# Patient Record
Sex: Male | Born: 1950 | Race: White | Hispanic: No | Marital: Married | Smoking: Former smoker
Health system: Southern US, Community
[De-identification: ages and names within clinical notes are randomized; demographics above are authoritative.]

## PROBLEM LIST (undated history)

## (undated) DIAGNOSIS — I110 Hypertensive heart disease with heart failure: Secondary | ICD-10-CM

## (undated) DIAGNOSIS — G4730 Sleep apnea, unspecified: Secondary | ICD-10-CM

## (undated) DIAGNOSIS — R7303 Prediabetes: Secondary | ICD-10-CM

## (undated) DIAGNOSIS — M1990 Unspecified osteoarthritis, unspecified site: Secondary | ICD-10-CM

## (undated) DIAGNOSIS — I700 Atherosclerosis of aorta: Secondary | ICD-10-CM

## (undated) DIAGNOSIS — E785 Hyperlipidemia, unspecified: Secondary | ICD-10-CM

## (undated) DIAGNOSIS — C61 Malignant neoplasm of prostate: Secondary | ICD-10-CM

## (undated) DIAGNOSIS — E538 Deficiency of other specified B group vitamins: Secondary | ICD-10-CM

## (undated) DIAGNOSIS — I10 Essential (primary) hypertension: Secondary | ICD-10-CM

## (undated) DIAGNOSIS — I739 Peripheral vascular disease, unspecified: Secondary | ICD-10-CM

## (undated) DIAGNOSIS — C801 Malignant (primary) neoplasm, unspecified: Secondary | ICD-10-CM

## (undated) DIAGNOSIS — N281 Cyst of kidney, acquired: Secondary | ICD-10-CM

## (undated) DIAGNOSIS — M8580 Other specified disorders of bone density and structure, unspecified site: Secondary | ICD-10-CM

## (undated) DIAGNOSIS — G4733 Obstructive sleep apnea (adult) (pediatric): Secondary | ICD-10-CM

## (undated) HISTORY — PX: OTHER SURGICAL HISTORY: SHX169

## (undated) HISTORY — PX: PROSTATE SURGERY: SHX751

---

## 1997-11-21 HISTORY — PX: KNEE ARTHROSCOPY: SUR90

## 2016-11-21 HISTORY — PX: PROSTATE SURGERY: SHX751

## 2018-11-21 HISTORY — PX: HERNIA REPAIR: SHX51

## 2019-04-22 HISTORY — PX: UMBILICAL HERNIA REPAIR: SHX196

## 2019-04-22 HISTORY — PX: HERNIA REPAIR: SHX51

## 2019-11-22 DIAGNOSIS — K52832 Lymphocytic colitis: Secondary | ICD-10-CM

## 2019-11-22 HISTORY — DX: Lymphocytic colitis: K52.832

## 2020-06-04 DIAGNOSIS — E782 Mixed hyperlipidemia: Secondary | ICD-10-CM | POA: Insufficient documentation

## 2020-06-17 DIAGNOSIS — Z8546 Personal history of malignant neoplasm of prostate: Secondary | ICD-10-CM | POA: Insufficient documentation

## 2020-06-18 DIAGNOSIS — K760 Fatty (change of) liver, not elsewhere classified: Secondary | ICD-10-CM | POA: Insufficient documentation

## 2020-06-18 DIAGNOSIS — M8589 Other specified disorders of bone density and structure, multiple sites: Secondary | ICD-10-CM | POA: Insufficient documentation

## 2020-06-18 DIAGNOSIS — I739 Peripheral vascular disease, unspecified: Secondary | ICD-10-CM | POA: Insufficient documentation

## 2020-06-18 DIAGNOSIS — I110 Hypertensive heart disease with heart failure: Secondary | ICD-10-CM | POA: Insufficient documentation

## 2020-06-18 DIAGNOSIS — G4733 Obstructive sleep apnea (adult) (pediatric): Secondary | ICD-10-CM | POA: Insufficient documentation

## 2020-06-18 DIAGNOSIS — N281 Cyst of kidney, acquired: Secondary | ICD-10-CM | POA: Insufficient documentation

## 2020-06-18 DIAGNOSIS — I25119 Atherosclerotic heart disease of native coronary artery with unspecified angina pectoris: Secondary | ICD-10-CM | POA: Insufficient documentation

## 2020-06-19 DIAGNOSIS — E538 Deficiency of other specified B group vitamins: Secondary | ICD-10-CM | POA: Insufficient documentation

## 2020-06-19 DIAGNOSIS — R7303 Prediabetes: Secondary | ICD-10-CM | POA: Insufficient documentation

## 2020-09-03 ENCOUNTER — Other Ambulatory Visit
Admission: RE | Admit: 2020-09-03 | Discharge: 2020-09-03 | Disposition: A | Discharge status: Home / Self Care | Payer: Medicare PPO | Source: Ambulatory Visit | Attending: Gastroenterology | Admitting: Gastroenterology

## 2020-09-03 ENCOUNTER — Other Ambulatory Visit: Payer: Self-pay

## 2020-09-03 DIAGNOSIS — Z01812 Encounter for preprocedural laboratory examination: Secondary | ICD-10-CM | POA: Diagnosis present

## 2020-09-03 DIAGNOSIS — Z20822 Contact with and (suspected) exposure to covid-19: Secondary | ICD-10-CM | POA: Diagnosis not present

## 2020-09-03 LAB — SARS CORONAVIRUS 2 (TAT 6-24 HRS): SARS Coronavirus 2: NEGATIVE

## 2020-09-07 ENCOUNTER — Encounter
Admission: RE | Disposition: A | Discharge status: Home / Self Care | Payer: Self-pay | Source: Home / Self Care | Attending: Gastroenterology

## 2020-09-07 ENCOUNTER — Encounter: Payer: Self-pay | Admitting: *Deleted

## 2020-09-07 ENCOUNTER — Ambulatory Visit: Payer: Medicare PPO | Admitting: Anesthesiology

## 2020-09-07 ENCOUNTER — Ambulatory Visit
Admission: RE | Admit: 2020-09-07 | Discharge: 2020-09-07 | Disposition: A | Discharge status: Home / Self Care | Payer: Medicare PPO | Attending: Gastroenterology | Admitting: Gastroenterology

## 2020-09-07 DIAGNOSIS — K621 Rectal polyp: Secondary | ICD-10-CM | POA: Diagnosis not present

## 2020-09-07 DIAGNOSIS — R194 Change in bowel habit: Secondary | ICD-10-CM | POA: Insufficient documentation

## 2020-09-07 DIAGNOSIS — Z880 Allergy status to penicillin: Secondary | ICD-10-CM | POA: Diagnosis not present

## 2020-09-07 DIAGNOSIS — K640 First degree hemorrhoids: Secondary | ICD-10-CM | POA: Insufficient documentation

## 2020-09-07 DIAGNOSIS — Z7982 Long term (current) use of aspirin: Secondary | ICD-10-CM | POA: Insufficient documentation

## 2020-09-07 DIAGNOSIS — I10 Essential (primary) hypertension: Secondary | ICD-10-CM | POA: Diagnosis not present

## 2020-09-07 DIAGNOSIS — E669 Obesity, unspecified: Secondary | ICD-10-CM | POA: Diagnosis not present

## 2020-09-07 DIAGNOSIS — Z79899 Other long term (current) drug therapy: Secondary | ICD-10-CM | POA: Insufficient documentation

## 2020-09-07 DIAGNOSIS — Z8546 Personal history of malignant neoplasm of prostate: Secondary | ICD-10-CM | POA: Diagnosis not present

## 2020-09-07 DIAGNOSIS — G4730 Sleep apnea, unspecified: Secondary | ICD-10-CM | POA: Insufficient documentation

## 2020-09-07 DIAGNOSIS — D1779 Benign lipomatous neoplasm of other sites: Secondary | ICD-10-CM | POA: Diagnosis not present

## 2020-09-07 DIAGNOSIS — K52832 Lymphocytic colitis: Secondary | ICD-10-CM | POA: Insufficient documentation

## 2020-09-07 DIAGNOSIS — Z6832 Body mass index (BMI) 32.0-32.9, adult: Secondary | ICD-10-CM | POA: Insufficient documentation

## 2020-09-07 HISTORY — PX: COLONOSCOPY: SHX5424

## 2020-09-07 HISTORY — DX: Malignant (primary) neoplasm, unspecified: C80.1

## 2020-09-07 HISTORY — DX: Essential (primary) hypertension: I10

## 2020-09-07 HISTORY — DX: Sleep apnea, unspecified: G47.30

## 2020-09-07 SURGERY — COLONOSCOPY
Anesthesia: General

## 2020-09-07 MED ORDER — EPHEDRINE SULFATE 50 MG/ML IJ SOLN
INTRAMUSCULAR | Status: DC | PRN
  Administered 2020-09-07: 5 mg via INTRAVENOUS
  Administered 2020-09-07: 10 mg via INTRAVENOUS

## 2020-09-07 MED ORDER — SODIUM CHLORIDE 0.9 % IV SOLN: INTRAVENOUS | Status: DC

## 2020-09-07 MED ORDER — PROPOFOL 500 MG/50ML IV EMUL
INTRAVENOUS | Status: DC | PRN
  Administered 2020-09-07: 140 ug/kg/min via INTRAVENOUS

## 2020-09-07 MED ORDER — PROPOFOL 10 MG/ML IV BOLUS
INTRAVENOUS | Status: DC | PRN
  Administered 2020-09-07: 20 mg via INTRAVENOUS
  Administered 2020-09-07: 80 mg via INTRAVENOUS
  Administered 2020-09-07: 10 mg via INTRAVENOUS
  Administered 2020-09-07: 30 mg via INTRAVENOUS

## 2020-09-07 NOTE — Anesthesia Preprocedure Evaluation (Signed)
Anesthesia Evaluation  Patient identified by MRN, date of birth, ID band Patient awake    Reviewed: Allergy & Precautions, NPO status , Patient's Chart, lab work & pertinent test results  History of Anesthesia Complications Negative for: history of anesthetic complications  Airway Mallampati: II  TM Distance: >3 FB Neck ROM: Full    Dental  (+) Poor Dentition   Pulmonary sleep apnea and Continuous Positive Airway Pressure Ventilation , neg COPD,    breath sounds clear to auscultation- rhonchi (-) wheezing      Cardiovascular hypertension, Pt. on medications (-) CAD, (-) Past MI, (-) Cardiac Stents and (-) CABG  Rhythm:Regular Rate:Normal - Systolic murmurs and - Diastolic murmurs    Neuro/Psych neg Seizures negative neurological ROS  negative psych ROS   GI/Hepatic negative GI ROS, Neg liver ROS,   Endo/Other  negative endocrine ROSneg diabetes  Renal/GU negative Renal ROS     Musculoskeletal negative musculoskeletal ROS (+)   Abdominal (+) + obese,   Peds  Hematology negative hematology ROS (+)   Anesthesia Other Findings Past Medical History: No date: Cancer ([place])     Comment:  prostate No date: Hypertension No date: Sleep apnea   Reproductive/Obstetrics                             Anesthesia Physical Anesthesia Plan  ASA: II  Anesthesia Plan: General   Post-op Pain Management:    Induction: Intravenous  PONV Risk Score and Plan: 1 and Propofol infusion  Airway Management Planned: Natural Airway  Additional Equipment:   Intra-op Plan:   Post-operative Plan:   Informed Consent: I have reviewed the patients History and Physical, chart, labs and discussed the procedure including the risks, benefits and alternatives for the proposed anesthesia with the patient or authorized representative who has indicated his/her understanding and acceptance.     Dental advisory  given  Plan Discussed with: CRNA and Anesthesiologist  Anesthesia Plan Comments:         Anesthesia Quick Evaluation

## 2020-09-07 NOTE — Op Note (Signed)
[facility] Patient Name: Chad Norman Procedure Date: 09/07/2020 12:30 PM MRN: [id number] Account #: [id number] Date of Birth: [birth year] Admit Type: Outpatient Age: 69 Room: [facility] ENDO ROOM 4 Gender: Male Note Status: Finalized Procedure:             Colonoscopy Indications:           Change in bowel habits Providers:             Andrey Farmer MD, MD Referring MD:          Toni Arthurs (Referring MD) Medicines:             Monitored Anesthesia Care Complications:         No immediate complications. Estimated blood loss:                         Minimal. Procedure:             Pre-Anesthesia Assessment:                        - Prior to the procedure, a History and Physical was                         performed, and patient medications and allergies were                         reviewed. The patient is competent. The risks and                         benefits of the procedure and the sedation options and                         risks were discussed with the patient. All questions                         were answered and informed consent was obtained.                         Patient identification and proposed procedure were                         verified by the physician, the nurse, the anesthetist                         and the technician in the endoscopy suite. Mental                         Status Examination: alert and oriented. Airway                         Examination: normal oropharyngeal airway and neck                         mobility. Respiratory Examination: clear to                         auscultation. CV Examination: normal. Prophylactic                         Antibiotics: The  patient does not require prophylactic                         antibiotics. Prior Anticoagulants: The patient has                         taken no previous anticoagulant or antiplatelet                         agents. ASA Grade Assessment: II - A  patient with mild                         systemic disease. After reviewing the risks and                         benefits, the patient was deemed in satisfactory                         condition to undergo the procedure. The anesthesia                         plan was to use monitored anesthesia care (MAC).                         Immediately prior to administration of medications,                         the patient was re-assessed for adequacy to receive                         sedatives. The heart rate, respiratory rate, oxygen                         saturations, blood pressure, adequacy of pulmonary                         ventilation, and response to care were monitored                         throughout the procedure. The physical status of the                         patient was re-assessed after the procedure.                        After obtaining informed consent, the colonoscope was                         passed under direct vision. Throughout the procedure,                         the patient's blood pressure, pulse, and oxygen                         saturations were monitored continuously. The                         Colonoscope was introduced through the anus and  advanced to the the terminal ileum. The colonoscopy                         was performed without difficulty. The patient                         tolerated the procedure well. The quality of the bowel                         preparation was good. Findings:      The perianal and digital rectal examinations were normal.      The terminal ileum appeared normal.      There was a small lipoma, in the ascending colon.      A 2 mm polyp was found in the descending colon. The polyp was sessile.       The polyp was removed with a cold biopsy forceps. Resection and       retrieval were complete. Estimated blood loss was minimal.      A 2 mm polyp was found in the rectum. The polyp was sessile. The  polyp       was removed with a cold snare. Resection and retrieval were complete.       Estimated blood loss was minimal.      A diffuse area of granular mucosa was found in the rectum. Biopsies were       taken with a cold forceps for histology. Estimated blood loss was       minimal.      Biopsies for histology were taken with a cold forceps from the entire       colon for evaluation of microscopic colitis. Estimated blood loss was       minimal.      Non-bleeding internal hemorrhoids were found during retroflexion. The       hemorrhoids were Grade I (internal hemorrhoids that do not prolapse).      The exam was otherwise without abnormality on direct and retroflexion       views. Impression:            - The examined portion of the ileum was normal.                        - Small lipoma in the ascending colon.                        - One 2 mm polyp in the descending colon, removed with                         a cold biopsy forceps. Resected and retrieved.                        - One 2 mm polyp in the rectum, removed with a cold                         snare. Resected and retrieved.                        - Granularity in the rectum. Biopsied.                        -  Non-bleeding internal hemorrhoids.                        - The examination was otherwise normal on direct and                         retroflexion views.                        - Biopsies were taken with a cold forceps from the                         entire colon for evaluation of microscopic colitis. Recommendation:        - Discharge patient to home.                        - Resume previous diet.                        - Continue present medications.                        - Await pathology results.                        - Repeat colonoscopy date to be determined after                         pending pathology results are reviewed for                         surveillance.                        - Return to  referring physician as previously                         scheduled. Procedure Code(s):     --- Professional ---                        [phone], Colonoscopy, flexible; with removal of                         tumor(s), polyp(s), or other lesion(s) by snare                         technique                        [id number], 54, Colonoscopy, flexible; with biopsy, single                         or multiple Diagnosis Code(s):     --- Professional ---                        K64.0, First degree hemorrhoids                        D17.5, Benign lipomatous neoplasm of intra-abdominal  organs                        K63.5, Polyp of colon                        K62.1, Rectal polyp                        K62.89, Other specified diseases of anus and rectum                        R19.4, Change in bowel habit CPT copyright 2019 [employer]. All rights reserved. The codes documented in this report are preliminary and upon coder review may  be revised to meet current compliance requirements. Andrey Farmer, MD Andrey Farmer MD, MD 09/07/2020 1:13:25 PM Number of Addenda: 0 Note Initiated On: 09/07/2020 12:30 PM Scope Withdrawal Time: 0 hours 17 minutes 28 seconds  Total Procedure Duration: 0 hours 26 minutes 59 seconds  Estimated Blood Loss:  Estimated blood loss was minimal.      [facility]

## 2020-09-07 NOTE — Transfer of Care (Signed)
Immediate Anesthesia Transfer of Care Note  Patient: Chad Norman  Procedure(s) Performed: COLONOSCOPY (N/A )  Patient Location: PACU  Anesthesia Type:General  Level of Consciousness: sedated  Airway & Oxygen Therapy: Patient Spontanous Breathing  Post-op Assessment: Report given to RN and Post -op Vital signs reviewed and stable  Post vital signs: Reviewed and stable  Last Vitals:  Vitals Value Taken Time  BP 113/62 09/07/20 1312  Temp    Pulse 69 09/07/20 1312  Resp 11 09/07/20 1312  SpO2 98 % 09/07/20 1312  Vitals shown include unvalidated device data.  Last Pain:  Vitals:   09/07/20 1158  TempSrc: Tympanic         Complications: No complications documented.

## 2020-09-07 NOTE — H&P (Signed)
Outpatient short stay form Pre-procedure 09/07/2020 12:08 PM Raylene Miyamoto MD, MPH  Primary Physician: NP Payton Mccallum  Reason for visit:  Change in bowel habits  History of present illness:   69 y/o gentleman with history of change in bowel movements with history of colonoscopy in 2018 but do not have full report. Looks like they recommended barium enema afterwards. No blood thinners. No family history of GI malignancies.    Current Facility-Administered Medications:  .  0.9 %  sodium chloride infusion, , Intravenous, Continuous, Johnpaul Gillentine, Hilton Cork, MD  Medications Prior to Admission  Medication Sig Dispense Refill Last Dose  . aspirin EC 81 MG tablet Take 81 mg by mouth daily. Swallow whole.     . Biotin 10000 MCG TABS Take by mouth.     . carvedilol (COREG) 12.5 MG tablet Take 12.5 mg by mouth 2 (two) times daily with a meal.   09/07/2020 at Unknown time  . chlorthalidone (HYGROTON) 25 MG tablet Take 25 mg by mouth daily as needed.   09/07/2020 at Unknown time  . Cholecalciferol (VITAMIN D) 50 MCG (2000 UT) tablet Take 2,000 Units by mouth daily.   09/07/2020 at Unknown time  . irbesartan (AVAPRO) 300 MG tablet Take 300 mg by mouth daily.     Marland Kitchen loratadine (CLARITIN) 10 MG tablet Take 10 mg by mouth daily.     . Misc Natural Products (AIRBORNE ELDERBERRY PO) Take by mouth.     . sildenafil (VIAGRA) 100 MG tablet Take 100 mg by mouth daily as needed for erectile dysfunction.     . vitamin B-12 (CYANOCOBALAMIN) 1000 MCG tablet Take 1,000 mcg by mouth daily.   09/07/2020 at Unknown time     Allergies  Allergen Reactions  . Amoxil [Amoxicillin]     diarrhea     Past Medical History:  Diagnosis Date  . [facility]    prostate  . Hypertension   . Sleep apnea     Review of systems:  Otherwise negative.    Physical Exam  Gen: Alert, oriented. Appears stated age.  HEENT: [place]/AT. PERRLA. Lungs: No respiratory distress CV: RRR Abd: soft, benign, no masses.  Ext: No edema.  Pulses 2+    Planned procedures: Proceed with colonoscopy. The patient understands the nature of the planned procedure, indications, risks, alternatives and potential complications including but not limited to bleeding, infection, perforation, damage to internal organs and possible oversedation/side effects from anesthesia. The patient agrees and gives consent to proceed.  Please refer to procedure notes for findings, recommendations and patient disposition/instructions.     Raylene Miyamoto MD, MPH Gastroenterology 09/07/2020  12:08 PM

## 2020-09-07 NOTE — Interval H&P Note (Signed)
History and Physical Interval Note:  09/07/2020 12:12 PM  Chad Norman  has presented today for surgery, with the diagnosis of bowel habit changes.  The various methods of treatment have been discussed with the patient and family. After consideration of risks, benefits and other options for treatment, the patient has consented to  Procedure(s): COLONOSCOPY (N/A) as a surgical intervention.  The patient's history has been reviewed, patient examined, no change in status, stable for surgery.  I have reviewed the patient's chart and labs.  Questions were answered to the patient's satisfaction.     [name]  Ok to proceed with colonoscopy

## 2020-09-07 NOTE — Anesthesia Postprocedure Evaluation (Signed)
Anesthesia Post Note  Patient: Chad Norman  Procedure(s) Performed: COLONOSCOPY (N/A )  Patient location during evaluation: Endoscopy Anesthesia Type: General Level of consciousness: awake and alert and oriented Pain management: pain level controlled Vital Signs Assessment: post-procedure vital signs reviewed and stable Respiratory status: spontaneous breathing, nonlabored ventilation and respiratory function stable Cardiovascular status: blood pressure returned to baseline and stable Postop Assessment: no signs of nausea or vomiting Anesthetic complications: no   No complications documented.   Last Vitals:  Vitals:   09/07/20 1312 09/07/20 1330  BP: 113/62   Pulse: 66   Resp: 10 16  Temp:    SpO2: 98%     Last Pain:  Vitals:   09/07/20 1330  TempSrc:   PainSc: 0-No pain                 Ahamed Hofland

## 2020-09-08 ENCOUNTER — Encounter: Payer: Self-pay | Admitting: Gastroenterology

## 2020-09-09 LAB — SURGICAL PATHOLOGY

## 2020-11-25 ENCOUNTER — Ambulatory Visit: Payer: Medicare Other | Attending: Urology | Admitting: Physical Therapy

## 2020-11-25 ENCOUNTER — Encounter: Payer: Self-pay | Admitting: Physical Therapy

## 2020-11-25 ENCOUNTER — Other Ambulatory Visit: Payer: Self-pay

## 2020-11-25 DIAGNOSIS — M6208 Separation of muscle (nontraumatic), other site: Secondary | ICD-10-CM | POA: Diagnosis present

## 2020-11-25 DIAGNOSIS — M533 Sacrococcygeal disorders, not elsewhere classified: Secondary | ICD-10-CM | POA: Diagnosis present

## 2020-11-25 DIAGNOSIS — R2689 Other abnormalities of gait and mobility: Secondary | ICD-10-CM | POA: Insufficient documentation

## 2020-11-25 DIAGNOSIS — M25561 Pain in right knee: Secondary | ICD-10-CM | POA: Diagnosis present

## 2020-11-25 DIAGNOSIS — M2170 Unequal limb length (acquired), unspecified site: Secondary | ICD-10-CM | POA: Insufficient documentation

## 2020-11-25 NOTE — Therapy (Signed)
[facility] MAIN [facility] SERVICES [address] [place], [place], [address] Phone: [phone]   Fax:  [phone]  Physical Therapy Evaluation  Patient Details  Name: Chad Norman MRN: [id number] Date of Birth: March 03, 1951 Referring Provider (PT): Evelene Croon   Encounter Date: 11/25/2020   PT End of Session - 11/25/20 1327    Visit Number 1    Number of Visits 10    Date for PT Re-Evaluation 02/03/21    PT Start Time 1300    PT Stop Time 1424    PT Time Calculation (min) 84 min           Past Medical History:  Diagnosis Date  . Cancer [facility])    prostate  . Hypertension   . Sleep apnea     Past Surgical History:  Procedure Laterality Date  . arthoscopy knee Right   . COLONOSCOPY N/A 09/07/2020   Procedure: COLONOSCOPY;  Surgeon: Regis Bill, MD;  Location: [facility] ENDOSCOPY;  Service: Endoscopy;  Laterality: N/A;  . HERNIA REPAIR  04/2019  . HERNIA REPAIR  2020   umbilical   . PROSTATE SURGERY  2018   RALP    There were no vitals filed for this visit.    Subjective Assessment - 11/25/20 1307    Subjective 1) SUI:  Pt had RALP for Prostate cancer in 2018 and learned to do kegel exercises but only kept up with them off and on for several years.  Pt reports 75% improvement with urinary leakage since the surgery.  Currently, pt is wearing 4-5 belted pads per day and had not had to use one at night. Very little leakage occurs at night. Nocturia 1-2 x night , usually 1x and pt uses his CPAP since 2003. Standing up is worst with leakage compared to lying down. Getting into the car also causes leakage.    2)  urinary frequency occurs more often after dinner.  Daily fluid intake: "not enough" water (16 fl oz), no sodas, teas, juice, 16 fl oz coffee.  3) painful tailbone in the last ten years. Pt used to have to sit on a donut. Hx of a fall onto tailbone in 1999 . Currently, pain is 3-4/10.     4)  loose bowels started 2021 but  previously pt had constipation.  Pt used to have Type 4 consistency 100% of the time [facility] Stool type) but lately it has been Type 6 100% of the time.   Hx of umbilical hernai repair 2020.     5) R knee pain with walking for  >30 min, standing for > 20 min, pain level  7/10.     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 Pt sought Tx from chiropractor and spine doctor and PT. No longer  has radiating pain.    Hobbies: fix things, plays keyboard, guitar, fishing      Patient Stated Goals decrease diapers, ride a bicycle              [facility] PT Assessment - 11/25/20 1312      Assessment   Medical Diagnosis SUI    Referring Provider (PT) Evelene Croon      Precautions   Precautions None      Restrictions   Weight Bearing Restrictions No      Balance Screen  Has the patient fallen in the past 6 months No      Observation/Other Assessments   Observations ankles crossed.    Scoliosis L iliac crest, R shoulder higher, T/L junction and L lumbar convex curve  ( Post Tx: with shoe lift in R shoe, shoulder and iliac crest levelled, no spinal deviations)      AROM   Overall AROM Comments all 6 directions of thoracic spine , very limited, R knee in supine -40 deg ext R, L -10 deg ext, seated: DF OKC unable to DF , L 30 deg      Strength   Overall Strength Comments BLE 4/5 , R hip flex 4-/5, L 5/5      Palpation   Spinal mobility limited lateral excursion  B    SI assessment  supine ASIS levelled, R knee in flexion/ hip abd/ER      Bed Mobility   Bed Mobility --   crunch method                     Objective measurements completed on examination: See above findings.     Pelvic Floor Special Questions - 11/25/20 1341    Diastasis Recti 3 fingers width, abdominal bulging,  scar restrictions at umbilicus    External Perineal Exam deferred for next  session. noted wet spot on sheet indication, incontinence through pants/ belted pad            [facility] Adult PT Treatment/Exercise - 11/25/20 1312      Ambulation/Gait   Gait Comments 0.79 m/s with gait deviations ( less R trunk lean, decreased R stance). ( Post Tx: 0.9 m/s more equal WBing on BLE, less lean, no decreased R stance phase)   no AD                      PT Long Term Goals - 11/25/20 1311      PT LONG TERM GOAL #1   Title Pt will increase gait speed from 0.79 m/s with less gait deviations ( less R trunk lean, decreased R stance) to increased speed > 1.0 m/s  without gait deviations ( reciporcal gait, equal WBing, less trunk lean) to minimize risks for falls and ambulate safely in community    Time 10    Period Weeks    Status New    Target Date 02/03/21      PT LONG TERM GOAL #2   Title Pt will report being able to to walking for  >30 min, standing > 20 min and report a decreased in R knee pain level  7/10 to > 3/10 in order to  participate in community activities    Time 8    Period Weeks    Status New    Target Date 01/20/21      PT LONG TERM GOAL #3   Title Pt will demo increased lateral diaphragmatic excursion,  less abdominal separation from 3 fingers width to < 2 fingers width, I with deep core coordination and strength HEP to improve IAP for better urinary continence and less risk for worsening of umbilical hernia    Time 5    Period Weeks    Status New    Target Date 12/30/20      PT LONG TERM GOAL #4   Title Pt will report decreasing his belted pad wear from 4-5 per day to < 2 per day in order to improve hygiene and demo improved  continence    Time 10    Period Weeks    Status New    Target Date 02/03/21      PT LONG TERM GOAL #5   Title Pt will demo equal pelvic girdle and less spinal deviations with compliance to R shoe lift in order to progress to deep core. / pelvic floor exercises with greater outcomes and to improve gait speed and to  decrease R knee pain/ tailbone pain    Time 2    Period Weeks    Status New    Target Date 12/09/20      Additional Long Term Goals   Additional Long Term Goals Yes      PT LONG TERM GOAL #6   Title Pt will demo improved FOTO scores for urinary problem from 51 pts to > 56 pts    and for R knee pain 47 pts to > 62 pts  in order to improve QOL and function    Time 10    Period Weeks    Status New    Target Date 02/03/21      PT LONG TERM GOAL #7   Title Pt will report improved stool consistency from Type 6 100% of the time to Type 4  50% of the time in order to restore GI health and GI motility with improved IAP system and motility    Time 10    Period Weeks    Status New    Target Date 02/03/21      PT LONG TERM GOAL #8   Title Pt will increase water intake from 16 fl oz to 32 floz while drinking 16 floz of coffe per day in order to improve water to bladder irritant ratio for bladder and bowel health    Time 4    Period Weeks    Status New    Target Date 12/23/20      PT LONG TERM GOAL  #9   TITLE Pt will demo proper body mechanics to minimize straining pelvic floor and minimize worsening of umbilicalhernia    Time 4    Period Weeks    Status New    Target Date 12/23/20      PT LONG TERM GOAL  #10   TITLE Pt will demo increased knee extension on R from -40 deg to < -20 deg  ( L knee ext -10 deg) in order to return riding a bicycle.    Time 8    Period Weeks    Status New    Target Date 01/20/21                  Plan - 11/25/20 1413    Clinical Impression Statement  Pt is a  71  yo who presents with SUI and urinary frequency, loose bowel movements, tailbone pain, and R knee pain. These deficits impact his ADLs and QOL.   Pt's musculoskeletal assessment revealed unequal leg length, scoliosis, unequal shoulder height, gait deviations , slowed gait speed, limited knee extension and DF AROM on R> L, spinal mobility severely limited in rotation/ sideflexion,   Diastasis recti with abdominal bulging, dyscoordination and strength of pelvic floor mm, poor body mechanics which places strain on the abdominal/pelvic floor mm.   These are deficits that indicate an ineffective intraabdominal pressure system associated with increased risk for pt's Sx. Pertinent Hx include RALP in [id number], umbilical hernia repair, Hx of a fall onto tailbone in 1999, R sciatica in 2015.  Pt was provided education on etiology of Sx with anatomy, physiology explanation with images along with the benefits of customized pelvic PT Tx based on pt's medical conditions and musculoskeletal deficits.  Explained the physiology of deep core mm coordination and roles of pelvic floor function in urination, defecation, sexual function, and postural control with deep core mm system.   Regional interdependent approaches will yield greater benefits in pt's POC due to the complexity of pt's medical Hx and the significant impact their Sx have had on their QOL.  Following Tx today which pt tolerated without complaints, pt demo'd equal alignment of pelvic girdle and shoulder height with R shoe lift. Pt also demo'd improved body mechanics to minimize straining abdominopelvic areas and worsening of umbilical hernia and optimize IAP system for less leakage in sit to stand t/f. Today's improvements with more equal alignment of pelvic girdle and less spinal deviations will yield longer lasting outcomes for pelvic floor training.  Next session,  plan to initiate manual Tx to optimize lateral and anterior excursion of diaphragm for better IAP system and pelvic floor function. Pt benefit from skilled PT.      Examination-Activity Limitations Continence;Toileting;Stand;Locomotion Level    Stability/Clinical Decision Making Evolving/Moderate complexity    Clinical Decision Making High    Rehab Potential Good    PT Frequency 1x / week    PT Duration Other (comment)   10   PT Treatment/Interventions Neuromuscular  re-education;Balance training;Scar mobilization;Stair training;Moist Heat;Therapeutic activities;Therapeutic exercise;Patient/family education;Functional mobility training;Cryotherapy;Biofeedback;Dry needling;Energy conservation;Manual techniques;Taping;Splinting;Joint Manipulations;Passive range of motion    Consulted and Agree with Plan of Care Patient;Family member/caregiver           Patient will benefit from skilled therapeutic intervention in order to improve the following deficits and impairments:  Increased muscle spasms,Postural dysfunction,Improper body mechanics,Pain,Abnormal gait,Decreased activity tolerance,Decreased coordination,Decreased mobility,Decreased endurance,Decreased range of motion,Difficulty walking,Decreased strength,Hypomobility,Impaired flexibility  Visit Diagnosis: Leg length difference, acquired  Sacrococcygeal disorders, not elsewhere classified  Diastasis recti  Other abnormalities of gait and mobility  Right knee pain, unspecified chronicity     Problem List There are no problems to display for this patient.   Jerl Mina ,PT, DPT, E-RYT  11/25/2020, 2:28 PM  [place] MAIN [facility] SERVICES [address] [place], [place], [address] Phone: [phone]   Fax:  [phone]  Name: Chad Norman MRN: [id number] Date of Birth: 11/22/1950

## 2020-11-25 NOTE — Patient Instructions (Signed)
  Avoid straining pelvic floor, abdominal muscles , spine  Use log rolling technique instead of getting out of bed with your neck or the sit-up     Log rolling into and out of bed   Log rolling into and out of bed If getting out of bed on R side, Bent knees, scoot hips/ shoulder to L  Raise R arm completely overhead, rolling onto armpit  Then lower bent knees to bed to get into complete side lying position  Then drop legs off bed, and push up onto R elbow/forearm, and use L hand to push onto the bed   ___   Proper body mechanics with getting out of a chair to decrease strain  on back &pelvic floor   Avoid holding your breath when Getting out of the chair:  Scoot to front part of chair chair Heels behind feet, feet are hip width apart, nose over toes  Inhale like you are smelling roses Exhale to stand    __  Wear R shoe lift this week and discontinue if causes pain   ___  Increase water intake from 16 fl oz to 32 fl oz if you keep 16 fl oz coffee  This week

## 2020-11-30 ENCOUNTER — Ambulatory Visit: Payer: Medicare Other | Admitting: Physical Therapy

## 2020-11-30 ENCOUNTER — Other Ambulatory Visit: Payer: Self-pay

## 2020-11-30 DIAGNOSIS — R2689 Other abnormalities of gait and mobility: Secondary | ICD-10-CM

## 2020-11-30 DIAGNOSIS — M2170 Unequal limb length (acquired), unspecified site: Secondary | ICD-10-CM | POA: Diagnosis not present

## 2020-11-30 DIAGNOSIS — M25561 Pain in right knee: Secondary | ICD-10-CM

## 2020-11-30 DIAGNOSIS — M533 Sacrococcygeal disorders, not elsewhere classified: Secondary | ICD-10-CM

## 2020-11-30 DIAGNOSIS — M6208 Separation of muscle (nontraumatic), other site: Secondary | ICD-10-CM

## 2020-11-30 NOTE — Patient Instructions (Signed)
6 directions of neck with rolled towel and anotehr towel under head    chin up and down  side bend   Rotation   10 reps each side    __  angel wings   Elbows bent,   10 reps   ___   Lengthen Back rib by R  shoulder    Lie on L  side , pillow between knees and under head  Pull  arm overhead over mattress, grab the edge of mattress,pull it upward, drawing elbow away from ears  Breathing 10 reps  Open book  Lying on  _ side , rotating  __ only this week  Rotating onto pillow /yoga block  Pillow/ Block between knees  10 reps

## 2020-11-30 NOTE — Therapy (Signed)
[place] MAIN [facility] SERVICES [address] [place], [place], [address] Phone: [phone]   Fax:  [phone]  Physical Therapy Treatment  Patient Details  Name: Chad Ferrentino MRN: [id number] Date of Birth: [birth year] Referring Provider (PT): Yves Dill   Encounter Date: 11/30/2020   PT End of Session - 11/30/20 1756    Visit Number 2    Number of Visits 10    Date for PT Re-Evaluation 02/03/21    PT Start Time 1504    PT Stop Time 1600    PT Time Calculation (min) 56 min           Past Medical History:  Diagnosis Date  . Cancer [facility])    prostate  . Hypertension   . Sleep apnea     Past Surgical History:  Procedure Laterality Date  . arthoscopy knee Right   . COLONOSCOPY N/A 09/07/2020   Procedure: COLONOSCOPY;  Surgeon: [name], MD;  Location[facility] ENDOSCOPY;  Service: Endoscopy;  Laterality: N/A;  . HERNIA REPAIR  04/2019  . HERNIA REPAIR  4034   umbilical   . PROSTATE SURGERY  2018   RALP    There were no vitals filed for this visit.   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 Pt sought Tx from chiropractor and spine doctor and PT. No longer  has radiating pain.    Patient Stated Goals decrease diapers, ride a bicycle              [facility] PT Assessment - 11/30/20 1551      Palpation   Spinal mobility tightness at thoracic/ medial scap/ cervical B    SI assessment  levelled pelvic girdle with shoe lift                         [facility] Adult PT Treatment/Exercise - 11/30/20 1552      Neuro Re-ed    Neuro Re-ed Details  cued for spinal stretches      Modalities   Modalities Moist Heat      Moist Heat Therapy   Number Minutes Moist Heat 5 Minutes     Moist Heat Location --   cervical and thoracic     Manual Therapy   Manual therapy comments STM/MWM at problem areas to promote mobility of spine                       PT Long Term Goals - 11/25/20 1311      PT LONG TERM GOAL #1   Title Pt will increase gait speed from 0.79 m/s with less gait deviations ( less R trunk lean, decreased R stance) to increased speed > 1.0 m/s  without gait deviations ( reciporcal gait, equal WBing, less trunk lean) to minimize risks for falls and ambulate safely in community    Time 10    Period Weeks    Status New    Target Date 02/03/21      PT LONG TERM GOAL #2   Title Pt will report being able to to walking for  >30 min, standing > 20 min and report a decreased in  R knee pain level  7/10 to > 3/10 in order to  participate in community activities    Time 8    Period Weeks    Status New    Target Date 01/20/21      PT LONG TERM GOAL #3   Title Pt will demo increased lateral diaphragmatic excursion,  less abdominal separation from 3 fingers width to < 2 fingers width, I with deep core coordination and strength HEP to improve IAP for better urinary continence and less risk for worsening of umbilical hernia    Time 5    Period Weeks    Status New    Target Date 12/30/20      PT LONG TERM GOAL #4   Title Pt will report decreasing his belted pad wear from 4-5 per day to < 2 per day in order to improve hygiene and demo improved continence    Time 10    Period Weeks    Status New    Target Date 02/03/21      PT LONG TERM GOAL #5   Title Pt will demo equal pelvic girdle and less spinal deviations with compliance to R shoe lift in order to progress to deep core. / pelvic floor exercises with greater outcomes and to improve gait speed and to decrease R knee pain/ tailbone pain    Time 2    Period Weeks    Status New    Target Date 12/09/20      Additional Long Term Goals   Additional Long Term Goals Yes      PT LONG TERM GOAL #6    Title Pt will demo improved FOTO scores for urinary problem from 51 pts to > 56 pts    and for R knee pain 47 pts to > 62 pts  in order to improve QOL and function    Time 10    Period Weeks    Status New    Target Date 02/03/21      PT LONG TERM GOAL #7   Title Pt will report improved stool consistency from Type 6 100% of the time to Type 4  50% of the time in order to restore GI health and GI motility with improved IAP system and motility    Time 10    Period Weeks    Status New    Target Date 02/03/21      PT LONG TERM GOAL #8   Title Pt will increase water intake from 16 fl oz to 32 floz while drinking 16 floz of coffe per day in order to improve water to bladder irritant ratio for bladder and bowel health    Time 4    Period Weeks    Status New    Target Date 12/23/20      PT LONG TERM GOAL  #9   TITLE Pt will demo proper body mechanics to minimize straining pelvic floor and minimize worsening of umbilicalhernia    Time 4    Period Weeks    Status New    Target Date 12/23/20      PT LONG TERM GOAL  #10   TITLE Pt will demo increased knee extension on R from -40 deg to < -20 deg  ( L knee ext -10 deg) in order to return riding a bicycle.    Time 8    Period Weeks    Status New    Target Date 01/20/21  Plan - 11/30/20 1757    Clinical Impression Statement Pt demo'd equal alignment of pelvic girdle and shoulder with shoe lift in R shoe that was provided at last week's session. Addressed pt's significantly limited spinal mobility in rotation, sideflexion and cervical limitations. Manual Tx helped to improve ROM in these areas which will help pt improve efficiency of IAP system for pelvic function.  Pt continues to benefit from skilled PT. Plan to progress to deep core coordination and strengthening at next session.    Examination-Activity Limitations Continence;Toileting;Stand;Locomotion Level    Stability/Clinical Decision Making Evolving/Moderate  complexity    Rehab Potential Good    PT Frequency 1x / week    PT Duration Other (comment)   10   PT Treatment/Interventions Neuromuscular re-education;Balance training;Scar mobilization;Stair training;Moist Heat;Therapeutic activities;Therapeutic exercise;Patient/family education;Functional mobility training;Cryotherapy;Biofeedback;Dry needling;Energy conservation;Manual techniques;Taping;Splinting;Joint Manipulations;Passive range of motion    Consulted and Agree with Plan of Care Patient;Family member/caregiver           Patient will benefit from skilled therapeutic intervention in order to improve the following deficits and impairments:  Increased muscle spasms,Postural dysfunction,Improper body mechanics,Pain,Abnormal gait,Decreased activity tolerance,Decreased coordination,Decreased mobility,Decreased endurance,Decreased range of motion,Difficulty walking,Decreased strength,Hypomobility,Impaired flexibility  Visit Diagnosis: Sacrococcygeal disorders, not elsewhere classified  Diastasis recti  Other abnormalities of gait and mobility  Leg length difference, acquired  Right knee pain, unspecified chronicity     Problem List There are no problems to display for this patient.   Chad Norman ,PT, DPT, E-RYT  11/30/2020, 5:57 PM  [place] MAIN [facility] SERVICES [address] [place], [place], [address] Phone: [phone]   Fax:  [phone]  Name: Orlando Muston MRN: [id number] Date of Birth: Aug 04, 1951

## 2020-12-07 ENCOUNTER — Encounter: Payer: Medicare Other | Admitting: Physical Therapy

## 2020-12-09 ENCOUNTER — Other Ambulatory Visit: Payer: Self-pay

## 2020-12-09 ENCOUNTER — Ambulatory Visit: Payer: Medicare Other | Admitting: Physical Therapy

## 2020-12-09 DIAGNOSIS — R2689 Other abnormalities of gait and mobility: Secondary | ICD-10-CM

## 2020-12-09 DIAGNOSIS — M2170 Unequal limb length (acquired), unspecified site: Secondary | ICD-10-CM

## 2020-12-09 DIAGNOSIS — M25561 Pain in right knee: Secondary | ICD-10-CM

## 2020-12-09 DIAGNOSIS — M6208 Separation of muscle (nontraumatic), other site: Secondary | ICD-10-CM

## 2020-12-09 DIAGNOSIS — M533 Sacrococcygeal disorders, not elsewhere classified: Secondary | ICD-10-CM

## 2020-12-09 NOTE — Therapy (Signed)
[place] MAIN [facility] SERVICES [address] [place], [place], [address] Phone: [phone]   Fax:  [phone]  Physical Therapy Treatment  Patient Details  Name: Chad Norman MRN: [id number] Date of Birth: 05-Jun-1951 Referring Provider (PT): Yves Dill   Encounter Date: 12/09/2020   PT End of Session - 12/09/20 1055    Visit Number 3    Number of Visits 10    Date for PT Re-Evaluation 02/03/21    PT Start Time 0900    PT Stop Time 1000    PT Time Calculation (min) 60 min           Past Medical History:  Diagnosis Date  . Cancer [facility])    prostate  . Hypertension   . Sleep apnea     Past Surgical History:  Procedure Laterality Date  . arthoscopy knee Right   . COLONOSCOPY N/A 09/07/2020   Procedure: COLONOSCOPY;  Surgeon: [name], MD;  Location: [facility] ENDOSCOPY;  Service: Endoscopy;  Laterality: N/A;  . HERNIA REPAIR  04/2019  . HERNIA REPAIR  [id number]   umbilical   . PROSTATE SURGERY  2018   RALP    There were no vitals filed for this visit.   Subjective Assessment - 12/09/20 0912    Subjective Pt reported the shoe lift is causing no issues. R knee pain is better because pt is no longer needing to ice it. Wife says he is walking fast.    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 Pt sought Tx from chiropractor and spine doctor and PT. No longer  has radiating pain.    Patient Stated Goals decrease diapers, ride a bicycle              [facility] PT Assessment - 12/09/20 0913      AROM   Overall AROM Comments increased sideflexion/ rotation spine,  R knee flexion decreased from -40 deg on      Palpation   Spinal mobility signficant hypomobility at    SI assessment  levelled pelvic girdle with shoe lift    Palpation comment coccyx scar restrictions, sacrum limited in nutation                          [facility] PT Treatment/Exercise - 12/09/20 1506      Neuro Re-ed    Neuro Re-ed Details  cued for seated spinal stretches      Modalities   Modalities Moist Heat      Moist Heat Therapy   Moist Heat Location --   cervical and thoracic     Manual Therapy   Manual therapy comments STM/MWM at problem areas to promote mobility of spine                       PT Long Term Goals - 11/25/20 1311      PT LONG TERM GOAL #1   Title Pt will increase gait speed from 0.79 m/s with less gait deviations ( less R trunk lean, decreased R stance) to increased speed > 1.0 m/s  without gait deviations ( reciporcal gait, equal WBing, less trunk lean) to minimize risks for falls and ambulate safely in community    Time 10    Period Weeks  Status New    Target Date 02/03/21      PT LONG TERM GOAL #2   Title Pt will report being able to to walking for  >30 min, standing > 20 min and report a decreased in R knee pain level  7/10 to > 3/10 in order to  participate in community activities    Time 8    Period Weeks    Status New    Target Date 01/20/21      PT LONG TERM GOAL #3   Title Pt will demo increased lateral diaphragmatic excursion,  less abdominal separation from 3 fingers width to < 2 fingers width, I with deep core coordination and strength HEP to improve IAP for better urinary continence and less risk for worsening of umbilical hernia    Time 5    Period Weeks    Status New    Target Date 12/30/20      PT LONG TERM GOAL #4   Title Pt will report decreasing his belted pad wear from 4-5 per day to < 2 per day in order to improve hygiene and demo improved continence    Time 10    Period Weeks    Status New    Target Date 02/03/21      PT LONG TERM GOAL #5   Title Pt will demo equal pelvic girdle and less spinal deviations with compliance to R shoe lift in order to progress to deep core. / pelvic floor exercises with greater outcomes and to  improve gait speed and to decrease R knee pain/ tailbone pain    Time 2    Period Weeks    Status New    Target Date 12/09/20      Additional Long Term Goals   Additional Long Term Goals Yes      PT LONG TERM GOAL #6   Title Pt will demo improved FOTO scores for urinary problem from 51 pts to > 56 pts    and for R knee pain 47 pts to > 62 pts  in order to improve QOL and function    Time 10    Period Weeks    Status New    Target Date 02/03/21      PT LONG TERM GOAL #7   Title Pt will report improved stool consistency from Type 6 100% of the time to Type 4  50% of the time in order to restore GI health and GI motility with improved IAP system and motility    Time 10    Period Weeks    Status New    Target Date 02/03/21      PT LONG TERM GOAL #8   Title Pt will increase water intake from 16 fl oz to 32 floz while drinking 16 floz of coffe per day in order to improve water to bladder irritant ratio for bladder and bowel health    Time 4    Period Weeks    Status New    Target Date 12/23/20      PT LONG TERM GOAL  #9   TITLE Pt will demo proper body mechanics to minimize straining pelvic floor and minimize worsening of umbilicalhernia    Time 4    Period Weeks    Status New    Target Date 12/23/20      PT LONG TERM GOAL  #10   TITLE Pt will demo increased knee extension on R from -40 deg to < -20  deg  ( L knee ext -10 deg) in order to return riding a bicycle.    Time 8    Period Weeks    Status New    Target Date 01/20/21                 Plan - 12/09/20 1057    Clinical Impression Statement Pt has been wearing shoe lift for 2 weeks and reports less R knee pain and needing to ice it as often. Measurement of R knee ext has improved by 50%.  It has also helped to level out pelvic girdle height and shoulder height. Pt demo'd improved spinal mobility but still lacked nutation of sacrum, and further spinal mobility. Manual Tx helped to improve these areas. Plan to  continue promote anterior tilt of pelvis and spinal mobility before progressing to deep core exercises for IAP system to impact incontinence. Pt continues to benefit from skilled PT    Examination-Activity Limitations Continence;Toileting;Stand;Locomotion Level    Stability/Clinical Decision Making Evolving/Moderate complexity    Rehab Potential Good    PT Frequency 1x / week    PT Duration Other (comment)   10   PT Treatment/Interventions Neuromuscular re-education;Balance training;Scar mobilization;Stair training;Moist Heat;Therapeutic activities;Therapeutic exercise;Patient/family education;Functional mobility training;Cryotherapy;Biofeedback;Dry needling;Energy conservation;Manual techniques;Taping;Splinting;Joint Manipulations;Passive range of motion    Consulted and Agree with Plan of Care Patient;Family member/caregiver           Patient will benefit from skilled therapeutic intervention in order to improve the following deficits and impairments:  Increased muscle spasms,Postural dysfunction,Improper body mechanics,Pain,Abnormal gait,Decreased activity tolerance,Decreased coordination,Decreased mobility,Decreased endurance,Decreased range of motion,Difficulty walking,Decreased strength,Hypomobility,Impaired flexibility  Visit Diagnosis: Sacrococcygeal disorders, not elsewhere classified  Diastasis recti  Other abnormalities of gait and mobility  Leg length difference, acquired  Right knee pain, unspecified chronicity     Problem List There are no problems to display for this patient.   Jerl Mina ,PT, DPT, E-RYT  12/09/2020, 3:07 PM  [place] MAIN [facility] SERVICES [address] [place], [place], [address] Phone: [phone]   Fax:  [phone]  Name: Chad Norman MRN: [id number] Date of Birth: [birth year]

## 2020-12-09 NOTE — Patient Instructions (Addendum)
Stretches :   10 reps  X 3 x day      6 directions of the spine :Rotation: rotate the spine  Side flexion: arm overhead arching like a rainbow , both sides  Forward and back with palms on thigh to arch chest up   ___  Seated hip flexor/ quad stretch at the corner of a chair without arms   10 reps of straighten knee with toes tucked   ___    Avoid straining pelvic floor, abdominal muscles , spine  Use log rolling technique instead of getting out of bed with your neck or the sit-up     Log rolling into and out of bed   Log rolling into and out of bed If getting out of bed on R side, Bent knees, scoot hips/ shoulder to L  Raise R arm completely overhead, rolling onto armpit  Then lower bent knees to bed to get into complete side lying position  Then drop legs off bed, and push up onto R elbow/forearm, and use L hand to push onto the bed

## 2020-12-21 ENCOUNTER — Ambulatory Visit: Payer: Medicare Other | Admitting: Physical Therapy

## 2020-12-21 ENCOUNTER — Other Ambulatory Visit: Payer: Self-pay

## 2020-12-21 DIAGNOSIS — R2689 Other abnormalities of gait and mobility: Secondary | ICD-10-CM

## 2020-12-21 DIAGNOSIS — M25561 Pain in right knee: Secondary | ICD-10-CM

## 2020-12-21 DIAGNOSIS — M6208 Separation of muscle (nontraumatic), other site: Secondary | ICD-10-CM

## 2020-12-21 DIAGNOSIS — M2170 Unequal limb length (acquired), unspecified site: Secondary | ICD-10-CM | POA: Diagnosis not present

## 2020-12-21 NOTE — Therapy (Signed)
[place] MAIN [facility] SERVICES [address] [place], [place], [address] Phone: [phone]   Fax:  [phone]  Physical Therapy Treatment  Patient Details  Name: Chad Norman MRN: [id number] Date of Birth: 1951-03-24 Referring Provider (PT): Yves Dill   Encounter Date: 12/21/2020   PT End of Session - 12/21/20 1517    Visit Number 4    Number of Visits 10    Date for PT Re-Evaluation 02/03/21    PT Start Time 1508    PT Stop Time 1601    PT Time Calculation (min) 53 min           Past Medical History:  Diagnosis Date  . Cancer [facility])    prostate  . Hypertension   . Sleep apnea     Past Surgical History:  Procedure Laterality Date  . arthoscopy knee Right   . COLONOSCOPY N/A 09/07/2020   Procedure: COLONOSCOPY;  Surgeon: [name], MD;  Location: [facility] ENDOSCOPY;  Service: Endoscopy;  Laterality: N/A;  . HERNIA REPAIR  04/2019  . HERNIA REPAIR  0277   umbilical   . PROSTATE SURGERY  2018   RALP    There were no vitals filed for this visit.   Subjective Assessment - 12/21/20 1518    Subjective Pt noticed when he was getting out of the shower and brushing teeth, he did not leak onto the pad on the floor which is new change. 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 Pt sought Tx from chiropractor and spine doctor and PT. No longer  has radiating pain.    Patient Stated Goals decrease diapers, ride a bicycle              [facility] PT Assessment - 12/21/20 1552      Palpation   Spinal mobility tightenss L intercostal/ paraspinals/ medial scapula mm tightness , limited diaphragm excursion    Palpation comment coccyx scar restrictions ( post Tx, decreased)                         [facility] Adult PT  Treatment/Exercise - 12/21/20 1553      Neuro Re-ed    Neuro Re-ed Details  cued for past HEP      Modalities   Modalities Moist Heat      Moist Heat Therapy   Moist Heat Location --   thoracic     Manual Therapy   Manual therapy comments STM/MWM at problem areas noted in assessment to promote diaphragm excursion and at coccyx scar ( scar mobilizatio)                       PT Long Term Goals - 11/25/20 1311      PT LONG TERM GOAL #1   Title Pt will increase gait speed from 0.79 m/s with less gait deviations ( less R trunk lean, decreased R stance) to increased speed > 1.0 m/s  without gait deviations ( reciporcal gait, equal WBing, less trunk lean) to minimize risks for falls and ambulate safely in community    Time 10    Period Weeks    Status New    Target Date 02/03/21  PT LONG TERM GOAL #2   Title Pt will report being able to to walking for  >30 min, standing > 20 min and report a decreased in R knee pain level  7/10 to > 3/10 in order to  participate in community activities    Time 8    Period Weeks    Status New    Target Date 01/20/21      PT LONG TERM GOAL #3   Title Pt will demo increased lateral diaphragmatic excursion,  less abdominal separation from 3 fingers width to < 2 fingers width, I with deep core coordination and strength HEP to improve IAP for better urinary continence and less risk for worsening of umbilical hernia    Time 5    Period Weeks    Status New    Target Date 12/30/20      PT LONG TERM GOAL #4   Title Pt will report decreasing his belted pad wear from 4-5 per day to < 2 per day in order to improve hygiene and demo improved continence    Time 10    Period Weeks    Status New    Target Date 02/03/21      PT LONG TERM GOAL #5   Title Pt will demo equal pelvic girdle and less spinal deviations with compliance to R shoe lift in order to progress to deep core. / pelvic floor exercises with greater outcomes and to improve gait  speed and to decrease R knee pain/ tailbone pain    Time 2    Period Weeks    Status New    Target Date 12/09/20      Additional Long Term Goals   Additional Long Term Goals Yes      PT LONG TERM GOAL #6   Title Pt will demo improved FOTO scores for urinary problem from 51 pts to > 56 pts    and for R knee pain 47 pts to > 62 pts  in order to improve QOL and function    Time 10    Period Weeks    Status New    Target Date 02/03/21      PT LONG TERM GOAL #7   Title Pt will report improved stool consistency from Type 6 100% of the time to Type 4  50% of the time in order to restore GI health and GI motility with improved IAP system and motility    Time 10    Period Weeks    Status New    Target Date 02/03/21      PT LONG TERM GOAL #8   Title Pt will increase water intake from 16 fl oz to 32 floz while drinking 16 floz of coffe per day in order to improve water to bladder irritant ratio for bladder and bowel health    Time 4    Period Weeks    Status New    Target Date 12/23/20      PT LONG TERM GOAL  #9   TITLE Pt will demo proper body mechanics to minimize straining pelvic floor and minimize worsening of umbilicalhernia    Time 4    Period Weeks    Status New    Target Date 12/23/20      PT LONG TERM GOAL  #10   TITLE Pt will demo increased knee extension on R from -40 deg to < -20 deg  ( L knee ext -10 deg) in order to return riding  a bicycle.    Time 8    Period Weeks    Status New    Target Date 01/20/21                 Plan - 12/21/20 1517    Clinical Impression Statement Pt no longer has tailbone pain anymore , even when leaning back. Addressed coccyx scar restrictions today to promote more extension for proper sitting. Shoe lift has addressed leg length difference.   Pt is noticing less leakage and is compliant with HEP for spinal/  Pelvic mobility. Further addressed L thoracic restrictions with manual Tx today to promote more diaphragmatic excursion which  will help with pelvic floor coordination and strength. Reviewed HEP and provided cues for proper technique. Pt continues to benefit from skilled PT.       Examination-Activity Limitations Continence;Toileting;Stand;Locomotion Level    Stability/Clinical Decision Making Evolving/Moderate complexity    Rehab Potential Good    PT Frequency 1x / week    PT Duration Other (comment)   10   PT Treatment/Interventions Neuromuscular re-education;Balance training;Scar mobilization;Stair training;Moist Heat;Therapeutic activities;Therapeutic exercise;Patient/family education;Functional mobility training;Cryotherapy;Biofeedback;Dry needling;Energy conservation;Manual techniques;Taping;Splinting;Joint Manipulations;Passive range of motion    Consulted and Agree with Plan of Care Patient;Family member/caregiver           Patient will benefit from skilled therapeutic intervention in order to improve the following deficits and impairments:  Increased muscle spasms,Postural dysfunction,Improper body mechanics,Pain,Abnormal gait,Decreased activity tolerance,Decreased coordination,Decreased mobility,Decreased endurance,Decreased range of motion,Difficulty walking,Decreased strength,Hypomobility,Impaired flexibility  Visit Diagnosis: Diastasis recti  Other abnormalities of gait and mobility  Leg length difference, acquired  Right knee pain, unspecified chronicity     Problem List There are no problems to display for this patient.   Jerl Mina ,PT, DPT, E-RYT  12/21/2020, 4:10 PM  [place] MAIN [facility] SERVICES [address] [place], [place], [address] Phone: [phone]   Fax:  [phone]  Name: Chad Norman MRN: [id number] Date of Birth: July 14, 1951

## 2020-12-29 ENCOUNTER — Ambulatory Visit: Payer: Medicare Other | Admitting: Physical Therapy

## 2021-01-04 ENCOUNTER — Other Ambulatory Visit: Payer: Self-pay

## 2021-01-04 ENCOUNTER — Ambulatory Visit: Payer: Medicare Other | Attending: Urology | Admitting: Physical Therapy

## 2021-01-04 DIAGNOSIS — M25561 Pain in right knee: Secondary | ICD-10-CM | POA: Insufficient documentation

## 2021-01-04 DIAGNOSIS — M6208 Separation of muscle (nontraumatic), other site: Secondary | ICD-10-CM | POA: Insufficient documentation

## 2021-01-04 DIAGNOSIS — M2170 Unequal limb length (acquired), unspecified site: Secondary | ICD-10-CM | POA: Diagnosis present

## 2021-01-04 DIAGNOSIS — M533 Sacrococcygeal disorders, not elsewhere classified: Secondary | ICD-10-CM | POA: Insufficient documentation

## 2021-01-04 DIAGNOSIS — R2689 Other abnormalities of gait and mobility: Secondary | ICD-10-CM | POA: Diagnosis present

## 2021-01-04 NOTE — Patient Instructions (Addendum)
  Clam Shell 45 Degrees  Lying with hips and knees bent 45, one pillow between knees and ankles. Heel together, toes apart like ballerina,  Lift knee with exhale while pressing heels together. Be sure pelvis does not roll backward. Do not arch back. Do 20 times, each leg, 2 times per day.  Figure-4 seated  To lengthen glut 5 breaths, hands behind back   __   __  Deep core level 1 and 2 ( handout)   ___   Avoid straining pelvic floor, abdominal muscles , spine  Use log rolling technique instead of getting out of bed with your neck or the sit-up     Log rolling into and out of bed   Log rolling into and out of bed If getting out of bed on R side, Bent knees, scoot hips/ shoulder to L  Raise R arm completely overhead, rolling onto armpit  Then lower bent knees to bed to get into complete side lying position  Then drop legs off bed, and push up onto R elbow/forearm, and use L hand to push onto the bed

## 2021-01-04 NOTE — Therapy (Signed)
[place] MAIN [facility] SERVICES [address] [place], [place], [address] Phone: [phone]   Fax:  [phone]  Physical Therapy Treatment  Patient Details  Name: Chad Norman MRN: [id number] Date of Birth: August 09, 1951 Referring Provider (PT): Yves Dill   Encounter Date: 01/04/2021   PT End of Session - 01/04/21 1615    Visit Number 5    Number of Visits 10    Date for PT Re-Evaluation 02/03/21    PT Start Time 1950    PT Stop Time 1700    PT Time Calculation (min) 56 min           Past Medical History:  Diagnosis Date  . Cancer [facility])    prostate  . Hypertension   . Sleep apnea     Past Surgical History:  Procedure Laterality Date  . arthoscopy knee Right   . COLONOSCOPY N/A 09/07/2020   Procedure: COLONOSCOPY;  Surgeon: [name], MD;  Location: [facility] ENDOSCOPY;  Service: Endoscopy;  Laterality: N/A;  . HERNIA REPAIR  04/2019  . HERNIA REPAIR  [id number]   umbilical   . PROSTATE SURGERY  2018   RALP    There were no vitals filed for this visit.   Subjective Assessment - 01/04/21 1613    Subjective Pt no longer has tailbone pain anymore.  Leakage is less by 50% when standing. Pt biked on stationery bike and it felt good. No leakage with biking.    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 Pt sought Tx from chiropractor and spine doctor and PT. No longer  has radiating pain.    Patient Stated Goals decrease diapers, ride a bicycle              [facility] PT Assessment - 01/04/21 1649      AROM   Overall AROM Comments R knee in supine -20 deg ext  (previously -40 deg)      Strength   Overall Strength Comments B hip abd 3/5                      Pelvic Floor Special Questions - 01/04/21 1649    External Perineal Exam proper lengthening of pelvic floor mm with coordination of  breath             [facility] Adult PT Treatment/Exercise - 01/04/21 1650      Neuro Re-ed    Neuro Re-ed Details  cued for clam shell HEP and deep core strengthening, cued for log rolling to minimize straining abdominopelvic area   modified figure-4 seated position                      PT Long Term Goals - 01/04/21 1625      PT LONG TERM GOAL #1   Title Pt will increase gait speed from 0.79 m/s with less gait deviations ( less R trunk lean, decreased R stance) to increased speed > 1.0 m/s  without gait deviations ( reciporcal gait, equal WBing, less trunk lean) to minimize risks for falls and ambulate safely in community    Time 10    Period Weeks    Status New      PT LONG TERM GOAL #2   Title Pt will report being able to to walking for  >  30 min, standing > 20 min and report a decreased in R knee pain level  7/10 to > 3/10 in order to  participate in community activities    Time 8    Period Weeks    Status New      PT LONG TERM GOAL #3   Title Pt will demo increased lateral diaphragmatic excursion,  less abdominal separation from 3 fingers width to < 2 fingers width, I with deep core coordination and strength HEP to improve IAP for better urinary continence and less risk for worsening of umbilical hernia    Time 5    Period Weeks    Status Partially Met      PT LONG TERM GOAL #4   Title Pt will report decreasing his belted pad wear from 4-5 per day to < 2 per day in order to improve hygiene and demo improved continence  ( 01/04/21:  4-5 /day and same amount of wetness)    Time 10    Period Weeks    Status Not Met      PT LONG TERM GOAL #5   Title Pt will demo equal pelvic girdle and less spinal deviations with compliance to R shoe lift in order to progress to deep core. / pelvic floor exercises with greater outcomes and to improve gait speed and to decrease R knee pain/ tailbone pain    Time 2    Period Weeks    Status Achieved      PT LONG TERM GOAL #6   Title  Pt will demo improved FOTO scores for PFDI urinary  from  29 pts to < 25  pts    and for R knee pain 47 pts to > 62 pts  in order to improve QOL and function (01/04/21: PFDI Urinary 25 pts ( 4 pt change) Knee 49 pts ( 2 pt change) ,    Time 10    Period Weeks    Status Partially Met      PT LONG TERM GOAL #7   Title Pt will report improved stool consistency from Type 6 100% of the time to Type 4  50% of the time in order to restore GI health and GI motility with improved IAP system and motility    Time 10    Period Weeks    Status Achieved      PT LONG TERM GOAL #8   Title Pt will increase water intake from 16 fl oz to 32 floz while drinking 16 floz of coffe per day in order to improve water to bladder irritant ratio for bladder and bowel health    Time 4    Period Weeks    Status Achieved      PT LONG TERM GOAL  #9   TITLE Pt will demo proper body mechanics to minimize straining pelvic floor and minimize worsening of umbilicalhernia    Time 4    Period Weeks    Status Achieved      PT LONG TERM GOAL  #10   TITLE Pt will demo increased knee extension on R from -40 deg to < -20 deg  ( L knee ext -10 deg) in order to return riding a bicycle.  ( 01/04/21: R  -20 deg)    Time 8    Period Weeks    Status Achieved                 Plan - 01/04/21 1615    Clinical  Impression Statement Pt progressed to deep core and hip abduction strengthening which will continue to help improve pt's incontinence. So far, pt reports 50% improvement with leakage but still is wearing same number of pads.    Pt required excessive cues for log rolling to minimize straining abdominopelvic area.  Pt's knee ROM on R improved with more extension range and he reports less pain with shoe lift to adjust for leg length difference.   Pt continues to benefit from skilled PT. Before adding resistance band strengthening, plan to discuss with pt re: heart monitor and get clearance from his cardiologist.   Session  started 12 min late because pt went to the bathroom prior.     Examination-Activity Limitations Continence;Toileting;Stand;Locomotion Level    Stability/Clinical Decision Making Evolving/Moderate complexity    Rehab Potential Good    PT Frequency 1x / week    PT Duration Other (comment)   10   PT Treatment/Interventions Neuromuscular re-education;Balance training;Scar mobilization;Stair training;Moist Heat;Therapeutic activities;Therapeutic exercise;Patient/family education;Functional mobility training;Cryotherapy;Biofeedback;Dry needling;Energy conservation;Manual techniques;Taping;Splinting;Joint Manipulations;Passive range of motion    Consulted and Agree with Plan of Care Patient;Family member/caregiver           Patient will benefit from skilled therapeutic intervention in order to improve the following deficits and impairments:  Increased muscle spasms,Postural dysfunction,Improper body mechanics,Pain,Abnormal gait,Decreased activity tolerance,Decreased coordination,Decreased mobility,Decreased endurance,Decreased range of motion,Difficulty walking,Decreased strength,Hypomobility,Impaired flexibility  Visit Diagnosis: Other abnormalities of gait and mobility  Diastasis recti  Leg length difference, acquired  Right knee pain, unspecified chronicity  Sacrococcygeal disorders, not elsewhere classified     Problem List There are no problems to display for this patient.   Jerl Mina 01/04/2021, 5:03 PM  [place] MAIN [facility] SERVICES [address] [place], [place], [address] Phone: [phone]   Fax:  [phone]  Name: Chad Norman MRN: [id number] Date of Birth: 11-Apr-1951

## 2021-01-11 ENCOUNTER — Other Ambulatory Visit: Payer: Self-pay

## 2021-01-11 ENCOUNTER — Ambulatory Visit: Payer: Medicare Other | Admitting: Physical Therapy

## 2021-01-11 VITALS — BP 124/60 | HR 59

## 2021-01-11 DIAGNOSIS — M25561 Pain in right knee: Secondary | ICD-10-CM

## 2021-01-11 DIAGNOSIS — R2689 Other abnormalities of gait and mobility: Secondary | ICD-10-CM

## 2021-01-11 DIAGNOSIS — M2170 Unequal limb length (acquired), unspecified site: Secondary | ICD-10-CM

## 2021-01-11 DIAGNOSIS — M6208 Separation of muscle (nontraumatic), other site: Secondary | ICD-10-CM

## 2021-01-11 DIAGNOSIS — M533 Sacrococcygeal disorders, not elsewhere classified: Secondary | ICD-10-CM

## 2021-01-11 NOTE — Patient Instructions (Signed)
Lying on back, knees bent    band under ballmounds  while laying on back w/ knees bent  "W" exercise  10 reps x 2 sets   Band is placed under feet, knees bent, feet are hip width apart Hold band with thumbs point out, keep upper arm and elbow touching the bed the whole time  - inhale and then exhale pull bands by bending elbows hands move in a "w"  (feel shoulder blades squeezing)    __________________________  Oblique/ scapula stabilization   Opposite arm   Place band in "U"    band under ballmounds  while laying on back w/ knees bent     20 reps  on each side  Holding band from opposite thigh,  Inhale,    exhale then pull band across body while keeping elbow , shoulders, back of the head pressed down    ______________  

## 2021-01-11 NOTE — Therapy (Addendum)
[place] MAIN [facility] SERVICES [address] [place], [place], [address] Phone: [phone]   Fax:  [phone]  Physical Therapy Treatment  Patient Details  Name: Chad Norman MRN: [id number] Date of Birth: February 07, 1951 Referring Provider (PT): Yves Dill   Encounter Date: 01/11/2021   PT End of Session - 01/11/21 8938    Visit Number 6    Number of Visits 10    Date for PT Re-Evaluation 02/03/21    PT Start Time 1017    PT Stop Time 1700    PT Time Calculation (min) 57 min           Past Medical History:  Diagnosis Date  . Cancer [facility])    prostate  . Hypertension   . Sleep apnea     Past Surgical History:  Procedure Laterality Date  . arthoscopy knee Right   . COLONOSCOPY N/A 09/07/2020   Procedure: COLONOSCOPY;  Surgeon: [name], MD;  Location: [facility] ENDOSCOPY;  Service: Endoscopy;  Laterality: N/A;  . HERNIA REPAIR  04/2019  . HERNIA REPAIR  5102   umbilical   . PROSTATE SURGERY  2018   RALP    Vitals:   01/11/21 1632 01/11/21 1644  BP: 123/70 124/60  Pulse: (!) 59 (!) 59  SpO2: 92% 96%     Subjective Assessment - 01/11/21 1603    Subjective Pt has the heart monitor removed last Wednesday. Leakage is "no bad". Pt is able to do both the long holds and quick pelvic floor exercises last week.    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 Pt sought Tx from chiropractor and spine doctor and PT. No longer  has radiating pain.    Patient Stated Goals decrease diapers, ride a bicycle              [facility] PT Assessment - 01/11/21 1655      Observation/Other Assessments   Observations upright posture      Coordination   Gross Motor Movements are Fluid and Coordinated --   poor feet propioception, required cues for deep core                     Pelvic Floor Special  Questions - 01/11/21 1655    Diastasis Recti abdominal bulging with bed mobility             [facility] Adult PT Treatment/Exercise - 01/11/21 1654      Therapeutic Activites    Other Therapeutic Activities monitored BP, HR,  Sp O 2%      Neuro Re-ed    Neuro Re-ed Details  cued for scapulo thoracic and oblique mm strengtehning with red band in hooklying   cued for clams/ figure-4 stretch     Exercises   Exercises --   see pt instructions                      PT Long Term Goals - 01/04/21 1625      PT LONG TERM GOAL #1   Title Pt will increase gait speed from 0.79 m/s with less gait deviations ( less R trunk lean, decreased R stance) to increased speed > 1.0 m/s  without gait deviations ( reciporcal gait, equal WBing, less trunk lean) to minimize risks for falls and ambulate safely in  community    Time 10    Period Weeks    Status New      PT LONG TERM GOAL #2   Title Pt will report being able to to walking for  >30 min, standing > 20 min and report a decreased in R knee pain level  7/10 to > 3/10 in order to  participate in community activities    Time 8    Period Weeks    Status New      PT LONG TERM GOAL #3   Title Pt will demo increased lateral diaphragmatic excursion,  less abdominal separation from 3 fingers width to < 2 fingers width, I with deep core coordination and strength HEP to improve IAP for better urinary continence and less risk for worsening of umbilical hernia    Time 5    Period Weeks    Status Partially Met      PT LONG TERM GOAL #4   Title Pt will report decreasing his belted pad wear from 4-5 per day to < 2 per day in order to improve hygiene and demo improved continence  ( 01/04/21:  4-5 /day and same amount of wetness)    Time 10    Period Weeks    Status Not Met      PT LONG TERM GOAL #5   Title Pt will demo equal pelvic girdle and less spinal deviations with compliance to R shoe lift in order to progress to deep core. / pelvic floor  exercises with greater outcomes and to improve gait speed and to decrease R knee pain/ tailbone pain    Time 2    Period Weeks    Status Achieved      PT LONG TERM GOAL #6   Title Pt will demo improved FOTO scores for PFDI urinary  from  29 pts to < 25  pts    and for R knee pain 47 pts to > 62 pts  in order to improve QOL and function (01/04/21: PFDI Urinary 25 pts ( 4 pt change) Knee 49 pts ( 2 pt change) ,    Time 10    Period Weeks    Status Partially Met      PT LONG TERM GOAL #7   Title Pt will report improved stool consistency from Type 6 100% of the time to Type 4  50% of the time in order to restore GI health and GI motility with improved IAP system and motility    Time 10    Period Weeks    Status Achieved      PT LONG TERM GOAL #8   Title Pt will increase water intake from 16 fl oz to 32 floz while drinking 16 floz of coffe per day in order to improve water to bladder irritant ratio for bladder and bowel health    Time 4    Period Weeks    Status Achieved      PT LONG TERM GOAL  #9   TITLE Pt will demo proper body mechanics to minimize straining pelvic floor and minimize worsening of umbilicalhernia    Time 4    Period Weeks    Status Achieved      PT LONG TERM GOAL  #10   TITLE Pt will demo increased knee extension on R from -40 deg to < -20 deg  ( L knee ext -10 deg) in order to return riding a bicycle.  ( 01/04/21: R  -20 deg)  Time 8    Period Weeks    Status Achieved                 Plan - 01/11/21 1633    Clinical Impression Statement Pt required cues for more stabilization with deep core exercises and technique with clam shell and figure-4 stretch. Advanced pt to scapulothoracic / oblique strengthening with red band. Excessive cues for technique.  Monitored BP, HR. Plan to advance to endurance pelvic floor contractions next session. Pt continues to benefit from skilled PT.  Pt needed to stop by the bathroom at the beginning of the sessions which took up  15 min of the hour session and thus, billed Tx time was only for 45 min.    Examination-Activity Limitations Continence;Toileting;Stand;Locomotion Level    Stability/Clinical Decision Making Evolving/Moderate complexity    Rehab Potential Good    PT Frequency 1x / week    PT Duration Other (comment)   10   PT Treatment/Interventions Neuromuscular re-education;Balance training;Scar mobilization;Stair training;Moist Heat;Therapeutic activities;Therapeutic exercise;Patient/family education;Functional mobility training;Cryotherapy;Biofeedback;Dry needling;Energy conservation;Manual techniques;Taping;Splinting;Joint Manipulations;Passive range of motion    Consulted and Agree with Plan of Care Patient;Family member/caregiver           Patient will benefit from skilled therapeutic intervention in order to improve the following deficits and impairments:  Increased muscle spasms,Postural dysfunction,Improper body mechanics,Pain,Abnormal gait,Decreased activity tolerance,Decreased coordination,Decreased mobility,Decreased endurance,Decreased range of motion,Difficulty walking,Decreased strength,Hypomobility,Impaired flexibility  Visit Diagnosis: Diastasis recti  Other abnormalities of gait and mobility  Right knee pain, unspecified chronicity  Leg length difference, acquired  Sacrococcygeal disorders, not elsewhere classified     Problem List There are no problems to display for this patient.   Jerl Mina ,PT, DPT, E-RYT  01/11/2021, 5:00PM  [place] MAIN [facility] SERVICES [address] [place], [place], [address] Phone: [phone]   Fax:  [phone]  Name: Chad Norman MRN: [id number] Date of Birth: [birth year]

## 2021-01-18 ENCOUNTER — Ambulatory Visit: Payer: Medicare Other | Admitting: Physical Therapy

## 2021-01-18 ENCOUNTER — Other Ambulatory Visit: Payer: Self-pay

## 2021-01-18 DIAGNOSIS — M25561 Pain in right knee: Secondary | ICD-10-CM

## 2021-01-18 DIAGNOSIS — M533 Sacrococcygeal disorders, not elsewhere classified: Secondary | ICD-10-CM

## 2021-01-18 DIAGNOSIS — R2689 Other abnormalities of gait and mobility: Secondary | ICD-10-CM

## 2021-01-18 DIAGNOSIS — M6208 Separation of muscle (nontraumatic), other site: Secondary | ICD-10-CM

## 2021-01-18 DIAGNOSIS — M2170 Unequal limb length (acquired), unspecified site: Secondary | ICD-10-CM

## 2021-01-18 NOTE — Therapy (Signed)
[place] MAIN [facility] SERVICES [address] [place], [place], [address] Phone: [phone]   Fax:  [phone]  Physical Therapy Treatment  Patient Details  Name: Chad Norman MRN: [id number] Date of Birth: Jun 08, 1951 Referring Provider (PT): Yves Dill   Encounter Date: 01/18/2021   PT End of Session - 01/18/21 1650    Visit Number 7    Number of Visits 10    Date for PT Re-Evaluation 02/03/21    PT Start Time 1600    PT Stop Time 1648    PT Time Calculation (min) 48 min           Past Medical History:  Diagnosis Date  . Cancer [facility])    prostate  . Hypertension   . Sleep apnea     Past Surgical History:  Procedure Laterality Date  . arthoscopy knee Right   . COLONOSCOPY N/A 09/07/2020   Procedure: COLONOSCOPY;  Surgeon: [name], MD;  Location: [facility] ENDOSCOPY;  Service: Endoscopy;  Laterality: N/A;  . HERNIA REPAIR  04/2019  . HERNIA REPAIR  [id number]   umbilical   . PROSTATE SURGERY  2018   RALP    There were no vitals filed for this visit.   Subjective Assessment - 01/18/21 1611    Subjective Pt had no issues with band exercises. Pt is still leaking alot. Pt noticed his pad is dry over night. Wetness in pads durign the day is less.    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 Pt sought Tx from chiropractor and spine doctor and PT. No longer  has radiating pain.    Patient Stated Goals decrease diapers, ride a bicycle                          Pelvic Floor Special Questions - 01/18/21 1636    External Perineal Exam hooklying 3 sec , 3 reps,  seated 3 reps ( initially more posterior activation, after cues more aanterior activation)             [facility] Adult PT Treatment/Exercise - 01/18/21 1636      Therapeutic Activites    Other Therapeutic Activities explained  spreading out pelvic floor contractions and do not do 20-30 reps      Neuro Re-ed    Neuro Re-ed Details  cued for pelvic floor endurance and quick contractions                       PT Long Term Goals - 01/04/21 1625      PT LONG TERM GOAL #1   Title Pt will increase gait speed from 0.79 m/s with less gait deviations ( less R trunk lean, decreased R stance) to increased speed > 1.0 m/s  without gait deviations ( reciporcal gait, equal WBing, less trunk lean) to minimize risks for falls and ambulate safely in community    Time 10    Period Weeks    Status New      PT LONG TERM GOAL #2   Title Pt will report being able to to walking for  >30 min, standing > 20 min and report a decreased in R knee pain level  7/10 to > 3/10 in order to  participate in community activities    Time  8    Period Weeks    Status New      PT LONG TERM GOAL #3   Title Pt will demo increased lateral diaphragmatic excursion,  less abdominal separation from 3 fingers width to < 2 fingers width, I with deep core coordination and strength HEP to improve IAP for better urinary continence and less risk for worsening of umbilical hernia    Time 5    Period Weeks    Status Partially Met      PT LONG TERM GOAL #4   Title Pt will report decreasing his belted pad wear from 4-5 per day to < 2 per day in order to improve hygiene and demo improved continence  ( 01/04/21:  4-5 /day and same amount of wetness)    Time 10    Period Weeks    Status Not Met      PT LONG TERM GOAL #5   Title Pt will demo equal pelvic girdle and less spinal deviations with compliance to R shoe lift in order to progress to deep core. / pelvic floor exercises with greater outcomes and to improve gait speed and to decrease R knee pain/ tailbone pain    Time 2    Period Weeks    Status Achieved      PT LONG TERM GOAL #6   Title Pt will demo improved FOTO scores for PFDI urinary  from  29 pts to < 25  pts    and for R knee pain 47  pts to > 62 pts  in order to improve QOL and function (01/04/21: PFDI Urinary 25 pts ( 4 pt change) Knee 49 pts ( 2 pt change) ,    Time 10    Period Weeks    Status Partially Met      PT LONG TERM GOAL #7   Title Pt will report improved stool consistency from Type 6 100% of the time to Type 4  50% of the time in order to restore GI health and GI motility with improved IAP system and motility    Time 10    Period Weeks    Status Achieved      PT LONG TERM GOAL #8   Title Pt will increase water intake from 16 fl oz to 32 floz while drinking 16 floz of coffe per day in order to improve water to bladder irritant ratio for bladder and bowel health    Time 4    Period Weeks    Status Achieved      PT LONG TERM GOAL  #9   TITLE Pt will demo proper body mechanics to minimize straining pelvic floor and minimize worsening of umbilicalhernia    Time 4    Period Weeks    Status Achieved      PT LONG TERM GOAL  #10   TITLE Pt will demo increased knee extension on R from -40 deg to < -20 deg  ( L knee ext -10 deg) in order to return riding a bicycle.  ( 01/04/21: R  -20 deg)    Time 8    Period Weeks    Status Achieved                 Plan - 01/18/21 1651    Clinical Impression Statement Pt demo'd significantly improved posture and deep core coordination and thus, pt was appropriate for pelvic floor strengthening. Pt required cues for more anterior pelvic floor activation when in a  seated position. Pt demo'd low endurance in hooklying position and will not be ready for long hold contractions in seated position until for several more weeks. Pt continues to benefit from skilled PT    Examination-Activity Limitations Continence;Toileting;Stand;Locomotion Level    Stability/Clinical Decision Making Evolving/Moderate complexity    Rehab Potential Good    PT Frequency 1x / week    PT Duration Other (comment)   10   PT Treatment/Interventions Neuromuscular re-education;Balance training;Scar  mobilization;Stair training;Moist Heat;Therapeutic activities;Therapeutic exercise;Patient/family education;Functional mobility training;Cryotherapy;Biofeedback;Dry needling;Energy conservation;Manual techniques;Taping;Splinting;Joint Manipulations;Passive range of motion    Consulted and Agree with Plan of Care Patient;Family member/caregiver           Patient will benefit from skilled therapeutic intervention in order to improve the following deficits and impairments:  Increased muscle spasms,Postural dysfunction,Improper body mechanics,Pain,Abnormal gait,Decreased activity tolerance,Decreased coordination,Decreased mobility,Decreased endurance,Decreased range of motion,Difficulty walking,Decreased strength,Hypomobility,Impaired flexibility  Visit Diagnosis: Diastasis recti  Other abnormalities of gait and mobility  Right knee pain, unspecified chronicity  Leg length difference, acquired  Sacrococcygeal disorders, not elsewhere classified     Problem List There are no problems to display for this patient.   Jerl Mina ,PT, DPT, E-RYT  01/18/2021, 4:52 PM  [place] MAIN [facility] SERVICES [address] [place], [place], [address] Phone: [phone]   Fax:  [phone]  Name: Chad Norman MRN: [id number] Date of Birth: October 26, 1951

## 2021-01-18 NOTE — Patient Instructions (Addendum)
PELVIC FLOOR / KEGEL EXERCISES   Pelvic floor/ Kegel exercises are used to strengthen the muscles in the base of your pelvis that are responsible for supporting your pelvic organs and preventing urine/feces leakage. Based on your therapist's recommendations, they can be performed while standing, sitting, or lying down.  Make yourself aware of this muscle group by using these cues:  Imagine you are in a crowded room and you feel the need to pass gas. Your response is to pull up and in at the rectum.  Close the rectum. Pull the muscles up inside your body,feeling your scrotum lifting as well . Feel the pelvic floor muscles lift as if you were walking into a cold lake.  Place your hand on top of your pubic bone. Tighten and draw in the muscles around the anal muscles without squeezing the buttock muscles.  Common Errors:  Breath holding: If you are holding your breath, you may be bearing down against your bladder instead of pulling it up. If you belly bulges up while you are squeezing, you are holding your breath. Be sure to breathe gently in and out while exercising. Counting out loud may help you avoid holding your breath.  Accessory muscle use: You should not see or feel other muscle movement when performing pelvic floor exercises. When done properly, no one can tell that you are performing the exercises. Keep the buttocks, belly and inner thighs relaxed.  Overdoing it: Your muscles can fatigue and stop working for you if you over-exercise. You may actually leak more or feel soreness at the lower abdomen or rectum.  YOUR HOME EXERCISE PROGRAM  LONG HOLDS: Position: on back  Inhale and then exhale. Then squeeze the muscle and count aloud for 3 seconds. Rest with three long breaths. (Be sure to let belly sink in with exhales and not push outward)  Perform 3  repetitions, _3_  different times/day  SHORT HOLDS: Position:  sitting   Inhale and then exhale. Then squeeze the muscle.  (Be sure  to let belly sink in with exhales and not push outward)  Perform 3 repetitions, 5  Times/day  **ALSO SQUEEZE BEFORE YOUR SNEEZE, COUGH, LAUGH to decrease downward pressure   **ALSO EXHALE BEFORE YOU RISE AGAINST GRAVITY (lifting, sit to stand, from squat to stand)    Morning Lying on back  1) Pelvic long holds: 3 sec holds, 3 rest breaths, 3 reps  ( keep count with fingers)  2) Deep core level 1   - 10 breaths  3) Deep core level 2   - 6 mins  4) Seated on chair, feet on ground Quick pelvic floor: 3 reps    Breakfast  1) Seated on chair, feet on ground Quick pelvic floor: 3 reps    Lunch 1) Seated on chair, feet on ground Quick pelvic floor: 3 reps    Midafternoon  1) Pelvic long holds: 3 sec holds, 3 rest breaths, 3 reps  ( keep count with fingers)    Dinner  1) Seated on chair, feet on ground Quick pelvic floor: 3 reps    Bed time:  1) Seated on chair, feet on ground Quick pelvic floor: 3 reps  2) Deep core level 1   - 10 breaths  3) Deep core level 2   - 6 mins  4) Pelvic long holds: 3 sec holds, 3 rest breaths, 3 reps  ( keep count with fingers)

## 2021-03-04 ENCOUNTER — Other Ambulatory Visit: Payer: Self-pay

## 2021-03-04 ENCOUNTER — Ambulatory Visit: Payer: Medicare Other | Attending: Urology | Admitting: Physical Therapy

## 2021-03-04 DIAGNOSIS — R2689 Other abnormalities of gait and mobility: Secondary | ICD-10-CM | POA: Insufficient documentation

## 2021-03-04 DIAGNOSIS — M533 Sacrococcygeal disorders, not elsewhere classified: Secondary | ICD-10-CM | POA: Diagnosis present

## 2021-03-04 DIAGNOSIS — M2170 Unequal limb length (acquired), unspecified site: Secondary | ICD-10-CM | POA: Diagnosis present

## 2021-03-04 DIAGNOSIS — M6208 Separation of muscle (nontraumatic), other site: Secondary | ICD-10-CM | POA: Insufficient documentation

## 2021-03-04 DIAGNOSIS — M25561 Pain in right knee: Secondary | ICD-10-CM | POA: Insufficient documentation

## 2021-03-04 NOTE — Therapy (Signed)
[place] MAIN [facility] SERVICES [address] [place], [place], [address] Phone: [phone]   Fax:  [phone]  Physical Therapy Treatment / Discharge Summary  Patient Details  Name: Chad Norman MRN: [id number] Date of Birth: May 11, 1951 Referring Provider (PT): Yves Dill   Encounter Date: 03/04/2021   PT End of Session - 03/04/21 1523    Visit Number 8    Number of Visits 10    Date for PT Re-Evaluation 03/07/21    PT Start Time 1500    PT Stop Time 1555    PT Time Calculation (min) 55 min    Activity Tolerance Patient tolerated treatment well    Behavior During Therapy [facility] for tasks assessed/performed           Past Medical History:  Diagnosis Date  . Cancer [facility])    prostate  . Hypertension   . Sleep apnea     Past Surgical History:  Procedure Laterality Date  . arthoscopy knee Right   . COLONOSCOPY N/A 09/07/2020   Procedure: COLONOSCOPY;  Surgeon: [name], MD;  Location: [facility] END[facility]COPY;  Service: Endoscopy;  Laterality: N/A;  . HERNIA REPAIR  04/2019  . HERNIA REPAIR  [id number]   umbilical   . PROSTATE SURGERY  2018   RALP    There were no vitals filed for this visit.   Subjective Assessment - 03/04/21 1517    Subjective PT reported a little improvement with leakage, pt has ben doing his exercises    Patient is accompained by: Family member   wife   Pertinent History OSA, Hx of a fall onto tailbone in 1999, Hx of R sciatic nerve damage from hurting his back in 2015. Pt sought Tx from chiropractor and spine doctor and PT. No longer  has radiating pain.    Patient Stated Goals decrease diapers, ride a bicycle              [facility] PT Assessment - 03/04/21 1517      Strength   Overall Strength Comments hip abduction B 5/5      Bed Mobility   Bed Mobility --   less abdominal bulging     Ambulation/Gait   Gait Comments 0.96 m/s, no trunk lean, more reciporcal                      Pelvic  Floor Special Questions - 03/04/21 1518    External Perineal Exam hooklying 3 sec , 4 reps,  seated 3 reps ( initially more posterior activation, after cues more aanterior activation)           Neuromuscular Re-education: cued for wall squats and CKCK hip abduction strengthening, functional movements with deep core                 PT Long Term Goals - 03/04/21 1518      PT LONG TERM GOAL #1   Title Pt will increase gait speed from 0.79 m/s with less gait deviations ( less R trunk lean, decreased R stance) to increased speed > 1.0 m/s  without gait deviations ( reciporcal gait, equal WBing, less trunk lean) to minimize risks for falls and ambulate safely in community   ( 03/04/21: 1.0 m/s)    Time 10    Period Weeks    Status Achieved      PT LONG TERM GOAL #2   Title Pt will report being able to to walking for  >30 min,  standing > 20 min and report a decreased in R knee pain level  7/10 to < 3/10 in order to  participate in community activities    Time 8    Period Weeks    Status Achieved      PT LONG TERM GOAL #3   Title Pt will demo increased lateral diaphragmatic excursion,  less abdominal separation from 3 fingers width to < 2 fingers width, I with deep core coordination and strength HEP to improve IAP for better urinary continence and less risk for worsening of umbilical hernia    Time 5    Period Weeks    Status Achieved      PT LONG TERM GOAL #4   Title Pt will report decreasing his belted pad wear from 4-5 per day to < 2 per day in order to improve hygiene and demo improved continence  ( 01/04/21:  4-5 /day and same amount of wetness . 03/04/21: 4-5 pads/ day but on good days  3-4 pads/ day, and good days are occuring 2-3 days per week and wife reports positive changes when going to church or going out)    Time 10    Period Weeks    Status Partially Met      PT LONG TERM GOAL #5   Title Pt will demo equal pelvic girdle and less spinal deviations with compliance to  R shoe lift in order to progress to deep core. / pelvic floor exercises with greater outcomes and to improve gait speed and to decrease R knee pain/ tailbone pain    Time 2    Period Weeks    Status Achieved      PT LONG TERM GOAL #6   Title Pt will demo improved FOTO scores for PFDI urinary  from  29 pts to < 25  pts     and for R knee pain 47 pts to > 62 pts  in order to improve QOL and function (01/04/21: PFDI Urinary 25 pts ( 4 pt change) Knee 49 pts ( 2 pt change)  03/04/21: Urinary  21 pts ( 8 pts of change) R   knee pain ( 55pt)  ,    Time 10    Period Weeks    Status Partially Met      PT LONG TERM GOAL #7   Title Pt will report improved stool consistency from Type 6 100% of the time to Type 4  50% of the time in order to restore GI health and GI motility with improved IAP system and motility    Time 10    Period Weeks    Status Achieved      PT LONG TERM GOAL #8   Title Pt will increase water intake from 16 fl oz to 32 floz while drinking 16 floz of coffe per day in order to improve water to bladder irritant ratio for bladder and bowel health    Time 4    Period Weeks    Status Achieved      PT LONG TERM GOAL  #9   TITLE Pt will demo proper body mechanics to minimize straining pelvic floor and minimize worsening of umbilicalhernia    Time 4    Period Weeks    Status Achieved      PT LONG TERM GOAL  #10   TITLE Pt will demo increased knee extension on R from -40 deg to < -20 deg  ( L knee ext -10 deg) in order  to return riding a bicycle.  ( 01/04/21: R  -20 deg)    Time 8    Period Weeks    Status Achieved                 Plan - 03/04/21 1524    Clinical Impression Statement Pt achieved 9/10 goals. Pt has decreased pads from 4-5 pads/ day to 3-4 pads/ day across 2-3 days per week now and wife reports positive changes when going to church or going out where pt is not going as often / spending as much time in the bathroom.  Pt's R knee pain has resolved and pt's gait  speed increased.   Pt's demo'd more equal alignment of pelvic girdle with shoe lift adjusting for leg length difference. Pt's posture has improved significantly with optimal diaphragmatic excursion, less diastasis recti, and improved pelvic floor coordination and strength. Pt's hip abduction has improved which helps both his knees and pelvic floor function.   Pt is IND with HEP and demonstrate proper technique to continue with kegel exericses progression for endurance and quick contractions.  Pt is ready for d/c at this time.    Examination-Activity Limitations Continence;Toileting;Stand;Locomotion Level    Stability/Clinical Decision Making Evolving/Moderate complexity    Rehab Potential Good    PT Frequency 1x / week    PT Duration Other (comment)   10   PT Treatment/Interventions Neuromuscular re-education;Balance training;Scar mobilization;Stair training;Moist Heat;Therapeutic activities;Therapeutic exercise;Patient/family education;Functional mobility training;Cryotherapy;Biofeedback;Dry needling;Energy conservation;Manual techniques;Taping;Splinting;Joint Manipulations;Passive range of motion    Consulted and Agree with Plan of Care Patient;Family member/caregiver           Patient will benefit from skilled therapeutic intervention in order to improve the following deficits and impairments:  Increased muscle spasms,Postural dysfunction,Improper body mechanics,Pain,Abnormal gait,Decreased activity tolerance,Decreased coordination,Decreased mobility,Decreased endurance,Decreased range of motion,Difficulty walking,Decreased strength,Hypomobility,Impaired flexibility  Visit Diagnosis: Diastasis recti  Other abnormalities of gait and mobility  Right knee pain, unspecified chronicity  Leg length difference, acquired  Sacrococcygeal disorders, not elsewhere classified     Problem List There are no problems to display for this patient.   Jerl Mina ,PT, DPT,  E-RYT  03/04/2021, 4:58 PM  [place] MAIN [facility] SERVICES [address] [place], [place], [address] Phone: [phone]   Fax:  [phone]  Name: Chad Norman MRN: [id number] Date of Birth: 12/23/1950

## 2021-03-04 NOTE — Patient Instructions (Addendum)
Minisquat: Scoot buttocks back slight, hinge like you are looking at your reflection on a pond  Knees behind toes,  Inhale to "smell flowers"  Exhale on the rise "like rocket"  Do not lock knees, have more weight across ballmounds of feet, toes relaxed   10 reps x 3 x day   ___  Squat   Feet shoulder width apart, a few inches in front of wall, lean against wall by squeezing shoulder blades together and back of head pressed against the wall. Inhale as you lower by bending hips and knees just enough that you can still see your toes.  Exhale, engage deep core muscles as you rise up to stand. Repeat _10___ times. Do _2-3___ sessions per day.  __  Minisquat: Scoot buttocks back slight, hinge like you are looking at your reflection on a pond  Knees behind toes,  Inhale to "smell flowers"  Exhale on the rise "like rocket"  Do not lock knees, have more weight across ballmounds of feet, toes relaxed   THEN HALF step on both feet first lap with left foot leading down a hall way = 1 lap  Repeated with other foot leading =1 lap  2 laps each side

## 2021-06-18 DIAGNOSIS — R053 Chronic cough: Secondary | ICD-10-CM | POA: Insufficient documentation

## 2021-07-02 NOTE — H&P (Signed)
NAMECARLTON, Chad Norman MEDICAL RECORD NO: [id number] ACCOUNT NO: [id number] DATE OF BIRTH: [birth year] FACILITY: [facility] LOCATION: [facility] PHYSICIAN: Otelia Limes. Yves Dill, MD  History and Physical   DATE OF ADMISSION: 06/17/2021  Same day surgery is scheduled for 07/15/2021.  CHIEF COMPLAINT:  Urinary incontinence.  HISTORY OF PRESENT ILLNESS:  The patient is a 70 year old white male with a 4-year history of stress urinary incontinence following radical prostatectomy in [place] in 2018.  He has performed Kegel exercises without significant improvement.  He comes in  now for Macroplastique injection.  The patient also has erectile dysfunction and has developed erectile dysfunction following his surgery and currently uses TriMix injections with partial erections.  PAST MEDICAL HISTORY:  ALLERGIES  No drug allergies.  CURRENT MEDICATIONS:  Included loratadine, chlorthalidone, carvedilol, irbesartan, tamsulosin, sildenafil, aspirin, elderberry, and biotin.  PAST SURGICAL HISTORY: 1.  Removal of bladder stones, 2001. 2.  Meatotomy, 1980. 3.  TURP, 2010. 4.  Radical prostatectomy, 2018. 5.  Umbilical herniorrhaphy, 2020.  PAST AND CURRENT MEDICAL CONDITIONS: 1.  Hypertension. 2.  History of prostate cancer.  REVIEW OF SYSTEMS:  Patient denied chest pain, shortness of breath, diabetes or stroke or heart disease.  FAMILY HISTORY:  Father died at age 54 of heart failure.  Mother died at age 58 of lung cancer.  SOCIAL HISTORY:  The patient quit smoking in 1987, with a 20-pack-year history.  He consumes 12 alcoholic beverages per week.  PHYSICAL EXAMINATION: VITAL SIGNS:  Height 5 feet 10 inches, weight 269 pounds, BMI 39. GENERAL:  Well-nourished white male in no acute distress. HEENT:  Sclerae were clear.  Pupils were equally round, reactive to light and accommodation.  Extraocular movements were intact. NECK:  No audible carotid bruits.  No palpable masses. LYMPHATIC:  No palpable  cervical or inguinal adenopathy. PULMONARY:  Lungs clear to auscultation. CARDIOVASCULAR:  Regular rhythm and rate. ABDOMEN:  Soft, nontender abdomen. No palpable abdominal masses.  No CVA tenderness. GENITOURINARY:  Circumcised.  Testes were smooth and nontender. RECTAL:  No palpable rectal masses.  Prostate and seminal vessels absent. NEUROMUSCULAR:  Alert and oriented x3.  IMPRESSION: 1.  Post radical prostatectomy urinary incontinence. 2.  Erectile dysfunction.  PLAN:  Macroplastique injection.   NIK D: 07/02/2021 9:36:42 am T: 07/02/2021 10:37:00 am  JOB: [id number]

## 2021-07-08 ENCOUNTER — Other Ambulatory Visit: Payer: Self-pay

## 2021-07-08 ENCOUNTER — Encounter
Admission: RE | Admit: 2021-07-08 | Discharge: 2021-07-08 | Disposition: A | Discharge status: Home / Self Care | Payer: Medicare Other | Source: Ambulatory Visit | Attending: Urology | Admitting: Urology

## 2021-07-08 NOTE — Patient Instructions (Addendum)
Your procedure is scheduled on: 8/ 25/2022  Report to the Registration Desk on the 1st floor of the [facility]. To find out your arrival time, please call [phone] between 1PM - 3PM on: 07/14/2021   REMEMBER: Instructions that are not followed completely may result in serious medical risk, up to and including death; or upon the discretion of your surgeon and anesthesiologist your surgery may need to be rescheduled.  Do not eat food after midnight the night before surgery.  No gum chewing, lozengers or hard candies.  You may however, drink CLEAR liquids up to 2 hours before you are scheduled to arrive for your surgery. Do not drink anything within 2 hours of your scheduled arrival time.  Clear liquids include: - water  - apple juice without pulp - gatorade (not RED, PURPLE, OR BLUE) - black coffee or tea (Do NOT add milk or creamers to the coffee or tea) Do NOT drink anything that is not on this list.   TAKE THESE MEDICATIONS THE MORNING OF SURGERY WITH A SIP OF WATER:  Amlodipine-Norvasc) Coreg (carvedilol) 3.  Claritin (loratadine)   Follow recommendations from Cardiologist,  Pulmonologist or PCP regarding stopping Aspirin, Coumadin, Plavix, Eliquis, Pradaxa, or Pletal. Please call the surgeon's office about when to stop aspirin.   One week prior to surgery: Stop Anti-inflammatories (NSAIDS) such as Advil, Aleve, Ibuprofen, Motrin, Naproxen, Naprosyn and Aspirin based products such as Excedrin, Goodys Powder, BC Powder. Stop ANY OVER THE COUNTER supplements until after surgery like, biotin, vit d 3, airborne elderberry, Vit b-12 You may however, continue to take Tylenol if needed for pain up until the day of surgery.   No Alcohol for 24 hours before or after surgery.  No Smoking including e-cigarettes for 24 hours prior to surgery.  No chewable tobacco products for at least 6 hours prior to surgery.  No nicotine patches on the day of surgery.  Do not use any  "recreational" drugs for at least a week prior to your surgery.  Please be advised that the combination of cocaine and anesthesia may have negative outcomes, up to and including death. If you test positive for cocaine, your surgery will be cancelled.  On the morning of surgery brush your teeth with toothpaste and water, you may rinse your mouth with mouthwash if you wish. Do not swallow any toothpaste or mouthwash.  Do not wear jewelry.  Do not wear lotions, powders, or perfumes.   Do not shave body from the neck down 48 hours prior to surgery just in case you cut yourself which could leave a site for infection.  Also, freshly shaved skin may become irritated if using the CHG soap.  Contact lenses, hearing aids and dentures may not be worn into surgery.  Do not bring valuables to the hospital. [facility] is not responsible for any missing/lost belongings or valuables.   Use CHG Soap or wipes as directed on instruction sheet.-provided for you  Bring your CPAP to the hospital on day of surgery.    Notify your doctor if there is any change in your medical condition (cold, fever, infection).  Wear comfortable clothing (specific to your surgery type) to the hospital.  After surgery, you can help prevent lung complications by doing breathing exercises.  Take deep breaths and cough every 1-2 hours. Your doctor may order a device called an Incentive Spirometer to help you take deep breaths.  If you are being admitted to the hospital overnight, leave your suitcase in the  car. After surgery it may be brought to your room.  If you are being discharged the day of surgery, you will not be allowed to drive home. You will need a responsible adult (18 years or older) to drive you home and stay with you that night.   If you are taking public transportation, you will need to have a responsible adult (18 years or older) with you. Please confirm with your physician that it is acceptable to use  public transportation.   Please call the [place] Dept. at [phone] if you have any questions about these instructions.  Surgery Visitation Policy:  Patients undergoing a surgery or procedure may have one family member or support person with them as long as that person is not COVID-19 positive or experiencing its symptoms.  That person may remain in the waiting area during the procedure.  Inpatient Visitation:    Visiting hours are 7 a.m. to 8 p.m. Inpatients will be allowed two visitors daily. The visitors may change each day during the patient's stay. No visitors under the age of 4. Any visitor under the age of 29 must be accompanied by an adult. The visitor must pass COVID-19 screenings, use hand sanitizer when entering and exiting the patient's room and wear a mask at all times, including in the patient's room. Patients must also wear a mask when staff or their visitor are in the room. Masking is required regardless of vaccination status.

## 2021-07-09 ENCOUNTER — Inpatient Hospital Stay: Admission: RE | Admit: 2021-07-09 | Payer: Medicare Other | Source: Ambulatory Visit

## 2021-07-09 ENCOUNTER — Encounter
Admission: RE | Admit: 2021-07-09 | Discharge: 2021-07-09 | Disposition: A | Discharge status: Home / Self Care | Payer: Medicare Other | Source: Ambulatory Visit | Attending: Urology | Admitting: Urology

## 2021-07-09 DIAGNOSIS — Z01810 Encounter for preprocedural cardiovascular examination: Secondary | ICD-10-CM | POA: Insufficient documentation

## 2021-07-12 ENCOUNTER — Encounter: Payer: Self-pay | Admitting: Urology

## 2021-07-12 NOTE — Progress Notes (Signed)
Perioperative Services  Pre-Admission/Anesthesia Testing Clinical Review  Date: 07/12/21  Patient Demographics:  Name: Chad Norman DOB:   [birth year] MRN:   [id number]  Planned Surgical Procedure(s):    Case: [id number] Date/Time: 07/15/21 0715   Procedure: CYSTOSCOPY MACROPLASTIQUE IMPLANT   Anesthesia type: Choice   Pre-op diagnosis: STRESS INCONTINIENCE   Location: [facility]   Surgeons: [name], MD     NOTE: Available PAT nursing documentation and vital signs have been reviewed. Clinical nursing staff has updated patient's PMH/PSHx, current medication list, and drug allergies/intolerances to ensure comprehensive history available to assist in medical decision making as it pertains to the aforementioned surgical procedure and anticipated anesthetic course. Extensive review of available clinical information performed. [place] PMH and PSHx updated with any diagnoses/procedures that  may have been inadvertently omitted during his intake with the pre-admission testing department's nursing staff.  Clinical Discussion:  Chad Norman is a 70 y.o. male who is submitted for pre-surgical anesthesia review and clearance prior to him undergoing the above procedure. Patient has never been a smoker. 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  Patient is followed by cardiology Nehemiah Massed, MD). He was last seen in the cardiology clinic on 02/24/2021; notes reviewed.  At the time of his clinic visit, patient reporting shortness of breath.  He denied any episodes of chest pain, PND, orthopnea, palpitations, significant peripheral edema, vertiginous symptoms, or presyncope/syncope.  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 Patient underwent stress echocardiogram on 07/02/2020  that revealed an LVEF of >55%.  There were no regional wall motion abnormalities.  There was no evidence of valvular stenosis.  Study interpreted as normal. Blood pressure reasonably controlled at 132/80 on currently prescribed CCB, beta-blocker, diuretic, and ARB therapies.  He is not on any type of lipid-lowering therapies for ASCVD prevention.  He has a prediabetes diagnosis; last Hgb A1c 5.8% when checked on 06/18/2021. Functional capacity, as defined by [facility], is documented as being >/= 4 METS.  No changes were made to his medication regimen.  Patient to follow-up with outpatient cardiology in 1 year or sooner if needed.  Patient is scheduled to undergo cystoscopy with microplastique implant on 07/15/2021 with Dr. Maryan Puls, MD.  Given patient's past medical history significant for cardiovascular disease, presurgical cardiac clearance was sought by the PAT team. "The patient is at the lowest risk possible for perioperative cardiovascular complications with the planned procedure.  The overall risk his procedure is low (<1%).  Currently has no evidence active and/or significant angina and/or congestive heart failure. Patient may proceed to surgery without restriction or need for further cardiovascular testing and an overall LOW risk".  This patient is on daily antiplatelet therapy.  He has been instructed on recommendations for holding his daily low-dose ASA for 4 days prior to his procedure with plans to restart as soon as postoperative bleeding risk felt to be minimized by his primary attending surgeon.  Patient is aware that his last dose of ASA will be on 07/10/2021.  Patient denies previous perioperative complications with anesthesia in the past. In review of the available records, it is noted that patient underwent a general anesthetic course here (ASA II) in 08/2020 without documented complications.   Vitals with BMI 07/08/2021 01/11/2021 01/11/2021  Height _0  - -  Weight 260 lbs - -  BMI 35.25  - -  Systolic - 974 163  Diastolic - 60 70  Pulse - 59 59    Providers/Specialists:   NOTE: Primary physician provider listed below. Patient may have been seen by APP or partner within same practice.   PROVIDER ROLE / SPECIALTY LAST Towana Badger, MD Urology (Surgeon) 07/02/2021  Toni Arthurs, NP Primary Care Provider 06/18/2021  Serafina Royals, MD Cardiology 02/24/2021   Allergies:  Amoxil [amoxicillin]  Current Home Medications:   No current facility-administered medications for this encounter.    amLODipine (NORVASC) 2.5 MG tablet   aspirin EC 81 MG tablet   Biotin 10000 MCG TABS   carvedilol (COREG) 12.5 MG tablet   chlorthalidone (HYGROTON) 25 MG tablet   Cholecalciferol (VITAMIN D) 50 MCG (2000 UT) tablet   loratadine (CLARITIN) 10 MG tablet   Misc Natural Products (AIRBORNE ELDERBERRY PO)   telmisartan (MICARDIS) 80 MG tablet   vitamin B-12 (CYANOCOBALAMIN) 1000 MCG tablet   History:   Past Medical History:  Diagnosis Date   Aortic atherosclerosis (HCC)    B12 deficiency    HLD (hyperlipidemia)    Hypertension    Hypertensive heart disease with chronic diastolic congestive heart failure (HCC)    Lymphocytic colitis 2021   OSA on CPAP    Osteoarthritis    Osteopenia    Prediabetes    Prostate cancer (HCC)    PVD (peripheral vascular disease) ([place])    Renal cyst, left    Past Surgical History:  Procedure Laterality Date   COLONOSCOPY N/A 09/07/2020   Procedure: COLONOSCOPY;  Surgeon: [name], MD;  Location: [facility];  Service: Endoscopy;  Laterality: N/A;   KNEE ARTHROSCOPY Right 1999   PROSTATE SURGERY N/A 8453   RALP   UMBILICAL HERNIA REPAIR N/A 04/2019   No family history on file. Social History   Tobacco Use   Smoking status: Never   Smokeless tobacco: Never  Vaping Use   Vaping Use: Never used  Substance Use Topics   Alcohol use: Yes    Alcohol/week: 3.0 standard drinks    Types: 3 Cans of beer per week     Pertinent Clinical Results:  LABS: Labs reviewed: Acceptable for surgery.   Ref Range & Units 06/18/2021  WBC (White Blood Cell Count) 4.1 - 10.2 10^3/uL 6.1   RBC (Red Blood Cell Count) 4.69 - 6.13 10^6/uL 4.47 Low    Hemoglobin 14.1 - 18.1 gm/dL 15.0   Hematocrit 40.0 - 52.0 % 44.5   MCV (Mean Corpuscular Volume) 80.0 - 100.0 fl 99.6   MCH (Mean Corpuscular Hemoglobin) 27.0 - 31.2 pg 33.6 High    MCHC (Mean Corpuscular Hemoglobin Concentration) 32.0 - 36.0 gm/dL 33.7   Platelet Count 150 - 450 10^3/uL 139 Low    RDW-CV (Red Cell Distribution Width) 11.6 - 14.8 % 13.0   MPV (Mean Platelet Volume) 9.4 - 12.4 fl 9.7   Neutrophils 1.50 - 7.80 10^3/uL 4.10   Lymphocytes 1.00 - 3.60 10^3/uL 1.40   Mixed Count 0.10 - 0.90 10^3/uL 0.60   Neutrophil % 32.0 - 70.0 % 66.6   Lymphocyte % 10.0 - 50.0 % 23.5   Mixed % 3.0 - 14.4 % 9.9   Resulting Agency  [facility]  Specimen Collected: 06/18/21 14:28 Last Resulted: 06/18/21 14:55  Received From: [facility]  Result Received: 06/25/21 10:07    Ref Range & Units 06/18/2021  Glucose 70 - 110 mg/dL 96   Sodium 136 - 145 mmol/L  140   Potassium 3.6 - 5.1 mmol/L 4.1   Chloride 97 - 109 mmol/L 106   Carbon Dioxide (CO2) 22.0 - 32.0 mmol/L 28.7   Urea Nitrogen (BUN) 7 - 25 mg/dL 14   Creatinine 0.7 - 1.3 mg/dL 0.7   Glomerular Filtration Rate (eGFR), MDRD Estimate >60 mL/min/1.73sq m 111   Calcium 8.7 - 10.3 mg/dL 9.1   AST  8 - 39 U/L 19   ALT  6 - 57 U/L 12   Alk Phos (alkaline Phosphatase) 34 - 104 U/L 55   Albumin 3.5 - 4.8 g/dL 3.8   Bilirubin, Total 0.3 - 1.2 mg/dL 0.8   Protein, Total 6.1 - 7.9 g/dL 6.5   A/G Ratio 1.0 - 5.0 gm/dL 1.4   Resulting Agency  [facility]  Specimen Collected: 06/18/21 14:28 Last Resulted: 06/21/21 11:45  Received From: [facility]  Result Received: 06/25/21 10:07    Ref Range & Units 06/18/2021  Hemoglobin A1C 4.2 - 5.6 % 5.8 High     Average Blood Glucose (Calc) mg/dL 120   Resulting Agency  [facility]   ECG: Date: 07/09/2021 Time ECG obtained: 1352 PM Rate: 67 bpm Rhythm: normal sinus Axis (leads I and aVF): Normal Intervals: PR 166 ms. QRS 102 ms. QTc 433 ms. ST segment and T wave changes: No evidence of acute ST segment elevation or depression Comparison: No previous tracings available for review and comparison.    IMAGING / PROCEDURES: STRESS ECHOCARDIOGRAM performed on 07/02/2020 LVEF 55% Left ventricular cavity size normal with no LVH Right ventricular cavity size normal No regional wall motion abnormalities No evidence of a pericardial effusion Normal stress echocardiogram  Impression and Plan:  Chad Norman has been referred for pre-anesthesia review and clearance prior to him undergoing the planned anesthetic and procedural courses. Available labs, pertinent testing, and imaging results were personally reviewed by me. This patient has been appropriately cleared by cardiology with an overall LOW risk of significant perioperative cardiovascular complications.  Based on clinical review performed today (07/12/21), barring any significant acute changes in the patient's overall condition, it is anticipated that he will be able to proceed with the planned surgical intervention. Any acute changes in clinical condition may necessitate his procedure being postponed and/or cancelled. Patient will meet with anesthesia team (MD and/or CRNA) on the day of his procedure for preoperative evaluation/assessment. Questions regarding anesthetic course will be fielded at that time.   Pre-surgical instructions were reviewed with the patient during his PAT appointment and questions were fielded by PAT clinical staff. Patient was advised that if any questions or concerns arise prior to his procedure then he should return a call to PAT and/or his surgeon's office to discuss.  Honor Loh, MSN, APRN, FNP-C,  CEN [facility]  Peri-operative Services Nurse Practitioner Phone: [phone] Fax: [phone] 07/12/21 1:14 PM  NOTE: This note has been prepared using Dragon dictation software. Despite my best ability to proofread, there is always the potential that unintentional transcriptional errors may still occur from this process.

## 2021-07-15 ENCOUNTER — Encounter: Payer: Self-pay | Admitting: Urology

## 2021-07-15 ENCOUNTER — Ambulatory Visit
Admission: RE | Admit: 2021-07-15 | Discharge: 2021-07-15 | Disposition: A | Discharge status: Home / Self Care | Payer: Medicare Other | Attending: Urology | Admitting: Urology

## 2021-07-15 ENCOUNTER — Ambulatory Visit: Payer: Medicare Other | Admitting: Urgent Care

## 2021-07-15 ENCOUNTER — Other Ambulatory Visit: Payer: Self-pay

## 2021-07-15 ENCOUNTER — Encounter
Admission: RE | Disposition: A | Discharge status: Home / Self Care | Payer: Self-pay | Source: Home / Self Care | Attending: Urology

## 2021-07-15 DIAGNOSIS — Z79899 Other long term (current) drug therapy: Secondary | ICD-10-CM | POA: Insufficient documentation

## 2021-07-15 DIAGNOSIS — Z8546 Personal history of malignant neoplasm of prostate: Secondary | ICD-10-CM | POA: Diagnosis not present

## 2021-07-15 DIAGNOSIS — Z8249 Family history of ischemic heart disease and other diseases of the circulatory system: Secondary | ICD-10-CM | POA: Diagnosis not present

## 2021-07-15 DIAGNOSIS — Z87891 Personal history of nicotine dependence: Secondary | ICD-10-CM | POA: Diagnosis not present

## 2021-07-15 DIAGNOSIS — Z801 Family history of malignant neoplasm of trachea, bronchus and lung: Secondary | ICD-10-CM | POA: Insufficient documentation

## 2021-07-15 DIAGNOSIS — Z880 Allergy status to penicillin: Secondary | ICD-10-CM | POA: Insufficient documentation

## 2021-07-15 DIAGNOSIS — Z6835 Body mass index (BMI) 35.0-35.9, adult: Secondary | ICD-10-CM | POA: Insufficient documentation

## 2021-07-15 DIAGNOSIS — Z7982 Long term (current) use of aspirin: Secondary | ICD-10-CM | POA: Insufficient documentation

## 2021-07-15 DIAGNOSIS — E669 Obesity, unspecified: Secondary | ICD-10-CM | POA: Insufficient documentation

## 2021-07-15 DIAGNOSIS — I110 Hypertensive heart disease with heart failure: Secondary | ICD-10-CM | POA: Diagnosis not present

## 2021-07-15 DIAGNOSIS — I5032 Chronic diastolic (congestive) heart failure: Secondary | ICD-10-CM | POA: Insufficient documentation

## 2021-07-15 DIAGNOSIS — N393 Stress incontinence (female) (male): Secondary | ICD-10-CM | POA: Diagnosis not present

## 2021-07-15 DIAGNOSIS — R32 Unspecified urinary incontinence: Secondary | ICD-10-CM | POA: Diagnosis present

## 2021-07-15 HISTORY — DX: Unspecified osteoarthritis, unspecified site: M19.90

## 2021-07-15 HISTORY — DX: Hypertensive heart disease with heart failure: I11.0

## 2021-07-15 HISTORY — PX: CYSTOSCOPY MACROPLASTIQUE IMPLANT: SHX6636

## 2021-07-15 HISTORY — DX: Peripheral vascular disease, unspecified: I73.9

## 2021-07-15 HISTORY — DX: Prediabetes: R73.03

## 2021-07-15 HISTORY — DX: Deficiency of other specified B group vitamins: E53.8

## 2021-07-15 HISTORY — DX: Atherosclerosis of aorta: I70.0

## 2021-07-15 HISTORY — DX: Cyst of kidney, acquired: N28.1

## 2021-07-15 HISTORY — DX: Obstructive sleep apnea (adult) (pediatric): G47.33

## 2021-07-15 HISTORY — DX: Other specified disorders of bone density and structure, unspecified site: M85.80

## 2021-07-15 HISTORY — DX: Malignant neoplasm of prostate: C61

## 2021-07-15 HISTORY — DX: Hyperlipidemia, unspecified: E78.5

## 2021-07-15 SURGERY — CYSTOSCOPY, WITH MACROPLASTIQUE INJECTION
Anesthesia: General | Site: Urethra

## 2021-07-15 MED ORDER — EPHEDRINE 5 MG/ML INJ
INTRAVENOUS | Status: AC
  Filled 2021-07-15: qty 5

## 2021-07-15 MED ORDER — CHLORHEXIDINE GLUCONATE 0.12 % MT SOLN: 15.0000 mL | Freq: Once | OROMUCOSAL | Status: AC

## 2021-07-15 MED ORDER — CEFAZOLIN SODIUM-DEXTROSE 1-4 GM/50ML-% IV SOLN
INTRAVENOUS | Status: AC
  Filled 2021-07-15: qty 50

## 2021-07-15 MED ORDER — ONDANSETRON HCL 4 MG/2ML IJ SOLN
INTRAMUSCULAR | Status: DC | PRN
  Administered 2021-07-15: 4 mg via INTRAVENOUS

## 2021-07-15 MED ORDER — LACTATED RINGERS IV SOLN: INTRAVENOUS | Status: DC

## 2021-07-15 MED ORDER — FAMOTIDINE 20 MG PO TABS
ORAL_TABLET | ORAL | Status: AC
  Administered 2021-07-15: 20 mg via ORAL
  Filled 2021-07-15: qty 1

## 2021-07-15 MED ORDER — GLYCOPYRROLATE 0.2 MG/ML IJ SOLN
INTRAMUSCULAR | Status: AC
  Filled 2021-07-15: qty 1

## 2021-07-15 MED ORDER — PHENYLEPHRINE HCL (PRESSORS) 10 MG/ML IV SOLN
INTRAVENOUS | Status: AC
  Filled 2021-07-15: qty 1

## 2021-07-15 MED ORDER — PROPOFOL 10 MG/ML IV BOLUS
INTRAVENOUS | Status: AC
  Filled 2021-07-15: qty 20

## 2021-07-15 MED ORDER — URIBEL 118 MG PO CAPS: 1.0000 | ORAL_CAPSULE | Freq: Four times a day (QID) | ORAL | 3 refills | Status: DC | PRN

## 2021-07-15 MED ORDER — BELLADONNA ALKALOIDS-OPIUM 16.2-60 MG RE SUPP
RECTAL | Status: AC
  Filled 2021-07-15: qty 1

## 2021-07-15 MED ORDER — DEXAMETHASONE SODIUM PHOSPHATE 10 MG/ML IJ SOLN
INTRAMUSCULAR | Status: AC
  Filled 2021-07-15: qty 1

## 2021-07-15 MED ORDER — 0.9 % SODIUM CHLORIDE (POUR BTL) OPTIME
TOPICAL | Status: DC | PRN
  Administered 2021-07-15: 1000 mL

## 2021-07-15 MED ORDER — GLYCOPYRROLATE 0.2 MG/ML IJ SOLN
INTRAMUSCULAR | Status: DC | PRN
  Administered 2021-07-15: .2 mg via INTRAVENOUS

## 2021-07-15 MED ORDER — MEPERIDINE HCL 25 MG/ML IJ SOLN: 6.2500 mg | INTRAMUSCULAR | Status: DC | PRN

## 2021-07-15 MED ORDER — DEXAMETHASONE SODIUM PHOSPHATE 10 MG/ML IJ SOLN
INTRAMUSCULAR | Status: DC | PRN
  Administered 2021-07-15: 10 mg via INTRAVENOUS

## 2021-07-15 MED ORDER — SODIUM CHLORIDE 0.9 % IR SOLN
Status: DC | PRN
  Administered 2021-07-15: 1500 mL

## 2021-07-15 MED ORDER — LIDOCAINE HCL (PF) 2 % IJ SOLN
INTRAMUSCULAR | Status: DC | PRN
  Administered 2021-07-15: 50 mg

## 2021-07-15 MED ORDER — FENTANYL CITRATE (PF) 100 MCG/2ML IJ SOLN
INTRAMUSCULAR | Status: AC
  Filled 2021-07-15: qty 2

## 2021-07-15 MED ORDER — LIDOCAINE HCL URETHRAL/MUCOSAL 2 % EX GEL
CUTANEOUS | Status: AC
  Filled 2021-07-15: qty 10

## 2021-07-15 MED ORDER — ONDANSETRON HCL 4 MG/2ML IJ SOLN: 4.0000 mg | Freq: Once | INTRAMUSCULAR | Status: DC | PRN

## 2021-07-15 MED ORDER — ONDANSETRON HCL 4 MG/2ML IJ SOLN
INTRAMUSCULAR | Status: AC
  Filled 2021-07-15: qty 2

## 2021-07-15 MED ORDER — FENTANYL CITRATE (PF) 100 MCG/2ML IJ SOLN: 25.0000 ug | INTRAMUSCULAR | Status: DC | PRN

## 2021-07-15 MED ORDER — LIDOCAINE HCL URETHRAL/MUCOSAL 2 % EX GEL
CUTANEOUS | Status: DC | PRN
  Administered 2021-07-15: 1 via URETHRAL

## 2021-07-15 MED ORDER — EPHEDRINE SULFATE 50 MG/ML IJ SOLN
INTRAMUSCULAR | Status: DC | PRN
  Administered 2021-07-15: 10 mg via INTRAVENOUS
  Administered 2021-07-15: 15 mg via INTRAVENOUS

## 2021-07-15 MED ORDER — ORAL CARE MOUTH RINSE: 15.0000 mL | Freq: Once | OROMUCOSAL | Status: AC

## 2021-07-15 MED ORDER — LIDOCAINE HCL (PF) 2 % IJ SOLN
INTRAMUSCULAR | Status: AC
  Filled 2021-07-15: qty 5

## 2021-07-15 MED ORDER — FAMOTIDINE 20 MG PO TABS: 20.0000 mg | ORAL_TABLET | Freq: Once | ORAL | Status: AC

## 2021-07-15 MED ORDER — FENTANYL CITRATE (PF) 100 MCG/2ML IJ SOLN
INTRAMUSCULAR | Status: DC | PRN
  Administered 2021-07-15: 50 ug via INTRAVENOUS
  Administered 2021-07-15 (×2): 25 ug via INTRAVENOUS

## 2021-07-15 MED ORDER — CIPROFLOXACIN HCL 500 MG PO TABS: 500.0000 mg | ORAL_TABLET | Freq: Two times a day (BID) | ORAL | 0 refills | Status: DC

## 2021-07-15 MED ORDER — BELLADONNA ALKALOIDS-OPIUM 16.2-60 MG RE SUPP
RECTAL | Status: DC | PRN
  Administered 2021-07-15: 1 via RECTAL

## 2021-07-15 MED ORDER — PROPOFOL 10 MG/ML IV BOLUS
INTRAVENOUS | Status: DC | PRN
  Administered 2021-07-15: 150 mg via INTRAVENOUS

## 2021-07-15 MED ORDER — CEFAZOLIN SODIUM-DEXTROSE 1-4 GM/50ML-% IV SOLN
1.0000 g | Freq: Once | INTRAVENOUS | Status: AC
  Administered 2021-07-15: 1 g via INTRAVENOUS

## 2021-07-15 MED ORDER — CHLORHEXIDINE GLUCONATE 0.12 % MT SOLN
OROMUCOSAL | Status: AC
  Administered 2021-07-15: 15 mL via OROMUCOSAL
  Filled 2021-07-15: qty 15

## 2021-07-15 SURGICAL SUPPLY — 20 items
BAG DRAIN CYSTO-URO LG1000N (MISCELLANEOUS) ×2 IMPLANT
CATH ROBINSON RED A/P 12FR (CATHETERS) ×1 IMPLANT
GAUZE 4X4 16PLY ~~LOC~~+RFID DBL (SPONGE) ×3 IMPLANT
GLOVE SURG ENC MOIS LTX SZ7 (GLOVE) ×4 IMPLANT
GLOVE SURG ENC MOIS LTX SZ7.5 (GLOVE) ×2 IMPLANT
GOWN STRL REUS W/ TWL LRG LVL3 (GOWN DISPOSABLE) ×1 IMPLANT
GOWN STRL REUS W/ TWL XL LVL3 (GOWN DISPOSABLE) ×1 IMPLANT
GOWN STRL REUS W/TWL LRG LVL3 (GOWN DISPOSABLE) ×2
GOWN STRL REUS W/TWL XL LVL3 (GOWN DISPOSABLE) ×2
INJECTION MACROPLASIQ 2.5ML UN (Female Continence) ×6 IMPLANT
KIT TURNOVER CYSTO (KITS) ×2 IMPLANT
MACROPLASTIQUE 2.5ML UNIT (Female Continence) ×6 IMPLANT
MANIFOLD NEPTUNE II (INSTRUMENTS) ×1 IMPLANT
NDL ENDOSCOPIC URO 20G (NEEDLE) ×2 IMPLANT
PACK CYSTO AR (MISCELLANEOUS) ×2 IMPLANT
SET CYSTO W/LG BORE CLAMP LF (SET/KITS/TRAYS/PACK) ×2 IMPLANT
SURGILUBE 2OZ TUBE FLIPTOP (MISCELLANEOUS) ×2 IMPLANT
WATER STERILE IRR 1000ML POUR (IV SOLUTION) ×2 IMPLANT
WATER STERILE IRR 3000ML UROMA (IV SOLUTION) ×2 IMPLANT
WATER STERILE IRR 500ML POUR (IV SOLUTION) ×2 IMPLANT

## 2021-07-15 NOTE — Op Note (Signed)
Preoperative diagnosis: Urinary incontinence (N39.3)  Postoperative diagnosis: Same  Procedure: Macroplastique urethral bulking procedure (CPT  [phone])  Surgeon: Otelia Limes. Yves Dill MD  Anesthesia: General  Indications:See the history and physical. After informed consent the above procedure(s) were requested     Technique and findings: After adequate general anesthesia been obtained patient was placed into dorsal lithotomy position and the perineum was prepped and draped in the usual fashion.  19 French cystoscope was coupled to the camera and advanced into the bladder.  No bladder mucosal lesions were identified.  Both ureteral orifices were identified and had clear efflux.  The urethral vesicle junction was identified and noted to be open without coaptation of the mucosal surfaces.  At this point the Macroplastique needle was introduced and 2 cc of Macroplastique injected at the 12 o'clock position showing good bulging of the mucosa.  A second injection was then performed at the 4 o'clock position and good bulging of the mucosa was noted.  1/3 injection was then performed at the 8 o'clock position and complete circumferential coaptation of the mucosa was noted.  A total of 6 cc of Macroplastique was used.  The cystoscope was removed.  The bladder was then compressed to simulate Valsalva maneuver and no leakage was noted.  A 10 French red Robinson catheter was placed and the bladder was emptied.  The catheter was then removed.  10 cc of viscous Xylocaine was instilled within the urethra and the bladder.  A B&O suppository was placed.  The procedure was then terminated and patient transferred to the recovery room in stable condition.

## 2021-07-15 NOTE — H&P (Signed)
Date of Initial H&P: 07/02/21  History reviewed, patient examined, no change in status, stable for surgery.

## 2021-07-15 NOTE — Anesthesia Postprocedure Evaluation (Signed)
Anesthesia Post Note  Patient: Chad Norman  Procedure(s) Performed: CYSTOSCOPY MACROPLASTIQUE IMPLANT (Urethra)  Patient location during evaluation: PACU Anesthesia Type: General Level of consciousness: awake and alert, awake and oriented Pain management: pain level controlled Vital Signs Assessment: post-procedure vital signs reviewed and stable Respiratory status: spontaneous breathing, nonlabored ventilation and respiratory function stable Cardiovascular status: blood pressure returned to baseline and stable Postop Assessment: no apparent nausea or vomiting Anesthetic complications: no   No notable events documented.   Last Vitals:  Vitals:   07/15/21 0842 07/15/21 0855  BP: 136/88 134/75  Pulse: 64 64  Resp: 12 12  Temp: (!) 36.2 C (!) 36.2 C  SpO2:      Last Pain:  Vitals:   07/15/21 0855  TempSrc: Temporal  PainSc: 0-No pain                 Phill Mutter

## 2021-07-15 NOTE — Discharge Instructions (Signed)
Injection Treatments for Urinary Incontinence, Care After The following information offers guidance on how to care for yourself after your procedure. Your health care provider may also give you more specific instructions. If you have problems or questions, contact your health careprovider. What can I expect after the procedure? After this procedure, it is common to have: Trouble passing urine. A small amount of blood in your urine. A burning or stinging sensation when passing urine. No improvement in your urinary incontinence for a few weeks. Follow these instructions at home: Catheter care If you have a urinary catheter in place, follow care instructions from your health care provider. You may be told to do the following things: Wash your hands with soap and water for at least 20 seconds before and after touching the catheter. Keep the area around the catheter clean and dry. Make sure that the catheter drainage bag is always below the level of your bladder. This stops urine from going back into the tubing and into your bladder. If using a bedside bag, do not lay it on the floor. If you cannot connect the bag to the side of your bed, hang the bag on a small stool or chair that is placed near the bed. If using a leg bag or belly bag, do not lie down with the bag attached to your leg or stomach. If using a leg bag, secure the tubing from your catheter to the leg bag with a small, stretchable wrap. This helps prevent the tubing from being pulled. Empty the catheter drainage bag when it is three-fourths full. Monitor the amount and color of your urine. Check to make sure that there are no twists, bends, or kinks in the catheter tube. Visit your health care provider to have the catheter removed.   Medicines Take over-the-counter and prescription medicines only as told by your health care provider. If you were prescribed an antibiotic medicine, take it as told by your health care provider. Do not  stop taking the antibiotic even if you start to feel better. General instructions Drink enough fluid to keep your urine pale yellow. Do not take baths, swim, or use a hot tub if you have a urinary catheter in place. If you were given a sedative during the procedure, it can affect you for several hours. Do not drive or operate machinery until your health care provider says that it is safe. Return to your normal activities as told by your health care provider. Ask your health care provider what activities are safe for you. Keep all follow-up visits. This is important. Contact a health care provider if you have: Blood in your urine for longer than a few days. Pain when passing urine that lasts for longer than a few days. A strong and uncomfortable urge to pass urine (urgency). Incontinence that does not improve after a few weeks. Get help right away if: You have a fever. You cannot urinate. You have cloudy or bad-smelling urine. You have pain when passing urine, and the pain is getting worse. You have pain in your lower back, also called the flank. You have a lot of blood in your urine. Summary After this procedure, it is common to have trouble passing urine and to have a small amount of blood in your urine. Your incontinence should improve in a few weeks. If you have a urinary catheter in place, follow care instructions from your health care provider. Return to your normal activities as told by your health care provider. This  information is not intended to replace advice given to you by your health care provider. Make sure you discuss any questions you have with your healthcare provider. Document Revised: 06/25/2020 Document Reviewed: 06/12/2020 Elsevier Patient Education  [place].

## 2021-07-15 NOTE — Transfer of Care (Signed)
Immediate Anesthesia Transfer of Care Note  Patient: Cade Swayzer  Procedure(s) Performed: CYSTOSCOPY MACROPLASTIQUE IMPLANT (Urethra)  Patient Location: PACU  Anesthesia Type:General  Level of Consciousness: awake and alert   Airway & Oxygen Therapy: Patient Spontanous Breathing and Patient connected to face mask oxygen  Post-op Assessment: Report given to RN and Post -op Vital signs reviewed and stable  Post vital signs: Reviewed  Last Vitals:  Vitals Value Taken Time  BP    Temp    Pulse    Resp    SpO2      Last Pain:  Vitals:   07/15/21 0654  TempSrc: Temporal  PainSc: 0-No pain         Complications: No notable events documented.

## 2021-07-15 NOTE — Anesthesia Procedure Notes (Signed)
Procedure Name: LMA Insertion Date/Time: 07/15/2021 7:37 AM Performed by: Rolla Plate, CRNA Pre-anesthesia Checklist: Patient identified, Patient being monitored, Timeout performed, Emergency Drugs available and Suction available Patient Re-evaluated:Patient Re-evaluated prior to induction Oxygen Delivery Method: Circle system utilized Preoxygenation: Pre-oxygenation with 100% oxygen Induction Type: IV induction Ventilation: Mask ventilation without difficulty LMA: LMA inserted LMA Size: 4.0 Tube type: Oral Number of attempts: 1 Placement Confirmation: positive ETCO2 and breath sounds checked- equal and bilateral Tube secured with: Tape Dental Injury: Teeth and Oropharynx as per pre-operative assessment

## 2021-07-15 NOTE — Anesthesia Preprocedure Evaluation (Addendum)
Anesthesia Evaluation  Patient identified by MRN, date of birth, ID band Patient awake    Reviewed: Allergy & Precautions, NPO status , Patient's Chart, lab work & pertinent test results, reviewed documented beta blocker date and time   History of Anesthesia Complications Negative for: history of anesthetic complications  Airway Mallampati: III  TM Distance: >3 FB Neck ROM: Full    Dental  (+) Poor Dentition   Pulmonary sleep apnea and Continuous Positive Airway Pressure Ventilation , neg COPD,    breath sounds clear to auscultation- rhonchi (-) wheezing      Cardiovascular hypertension, Pt. on medications and Pt. on home beta blockers + Peripheral Vascular Disease and +CHF  (-) CAD, (-) Past MI, (-) Cardiac Stents and (-) CABG  Rhythm:Regular Rate:Normal - Systolic murmurs and - Diastolic murmurs    Neuro/Psych neg Seizures negative neurological ROS  negative psych ROS   GI/Hepatic negative GI ROS, Neg liver ROS,   Endo/Other  negative endocrine ROSneg diabetes  Renal/GU Renal disease     Musculoskeletal  (+) Arthritis , Osteoarthritis,    Abdominal (+) + obese,   Peds  Hematology negative hematology ROS (+)   Anesthesia Other Findings . Hyperlipidemia, mixed  . Abnormal ECG  . H/O prostate cancer  . Renal cyst, left  . Hypertensive heart disease with chronic diastolic congestive heart failure (CMS-HCC)  . PVD . OSA on CPAP  . Aortic atherosclerosis (CMS-HCC)  . Osteopenia of multiple sites  . Fatty infiltration of liver  . Vitamin B12 deficiency  . Prediabetes  . Obesity (BMI 30-39.9), unspecified  . Trigger ring finger of right hand  . Chronic cough     Reproductive/Obstetrics                            Anesthesia Physical  Anesthesia Plan  ASA: 3  Anesthesia Plan: General   Post-op Pain Management:    Induction: Intravenous  PONV Risk Score and Plan: 1 and Propofol  infusion and Midazolam  Airway Management Planned: LMA  Additional Equipment:   Intra-op Plan:   Post-operative Plan:   Informed Consent: I have reviewed the patients History and Physical, chart, labs and discussed the procedure including the risks, benefits and alternatives for the proposed anesthesia with the patient or authorized representative who has indicated his/her understanding and acceptance.     Dental advisory given  Plan Discussed with: CRNA, Anesthesiologist and Surgeon  Anesthesia Plan Comments:       Anesthesia Quick Evaluation

## 2021-07-28 ENCOUNTER — Encounter: Payer: Self-pay | Admitting: Urology

## 2021-12-28 ENCOUNTER — Encounter: Payer: Self-pay | Admitting: Physical Therapy

## 2021-12-28 ENCOUNTER — Other Ambulatory Visit: Payer: Self-pay

## 2021-12-28 ENCOUNTER — Ambulatory Visit: Payer: Medicare Other | Attending: Gerontology | Admitting: Physical Therapy

## 2021-12-28 DIAGNOSIS — R2681 Unsteadiness on feet: Secondary | ICD-10-CM | POA: Diagnosis present

## 2021-12-28 DIAGNOSIS — M6281 Muscle weakness (generalized): Secondary | ICD-10-CM | POA: Insufficient documentation

## 2021-12-28 DIAGNOSIS — R2689 Other abnormalities of gait and mobility: Secondary | ICD-10-CM | POA: Diagnosis present

## 2021-12-28 DIAGNOSIS — R262 Difficulty in walking, not elsewhere classified: Secondary | ICD-10-CM | POA: Insufficient documentation

## 2021-12-28 DIAGNOSIS — R5381 Other malaise: Secondary | ICD-10-CM | POA: Diagnosis present

## 2021-12-28 NOTE — Therapy (Signed)
[facility] [address] [place], [place], [address] Phone: [phone]   Fax:  [phone]  Physical Therapy Evaluation  Patient Details  Name: Chad Norman MRN: [id number] Date of Birth: [birth year] Referring Provider (PT): [name]   Encounter Date: 12/28/2021   PT End of Session - 12/28/21 1548     Visit Number 1    Number of Visits 17    Date for PT Re-Evaluation 02/24/22    Authorization Type [employer] Medicare    Authorization Time Period VL based on medical necessity    Progress Note Due on Visit 10    PT Start Time 1345    PT Stop Time 1440    PT Time Calculation (min) 55 min    Equipment Utilized During Treatment Gait belt    Activity Tolerance Patient tolerated treatment well    Behavior During Therapy WFL for tasks assessed/performed             Past Medical History:  Diagnosis Date   Aortic atherosclerosis ([place])    B12 deficiency    HLD (hyperlipidemia)    Hypertension    Hypertensive heart disease with chronic diastolic congestive heart failure ([place])    Lymphocytic colitis 2021   OSA on CPAP    Osteoarthritis    Osteopenia    Prediabetes    Prostate cancer ([place])    PVD (peripheral vascular disease) ([place])    Renal cyst, left     Past Surgical History:  Procedure Laterality Date   COLONOSCOPY N/A 09/07/2020   Procedure: COLONOSCOPY;  Surgeon: [name], MD;  Location: [facility] ENDOSCOPY;  Service: Endoscopy;  Laterality: N/A;   CYSTOSCOPY MACROPLASTIQUE IMPLANT N/A 07/15/2021   Procedure: CYSTOSCOPY MACROPLASTIQUE IMPLANT;  Surgeon: [name], MD;  Location: [facility] ORS;  Service: Urology;  Laterality: N/A;   KNEE ARTHROSCOPY Right 1999   PROSTATE SURGERY N/A 6063   RALP   UMBILICAL HERNIA REPAIR N/A 04/2019    There were no vitals filed for this visit.    Subjective Assessment - 12/28/21 1542     Subjective Patient is a 71 year old male referred for physical deconditioning.  Patient reports that he was doing PT in [place] in 2022. Patient reports having trouble with balance. He reports difficulty with transferring and gait stability.    Pertinent History Patient is a 71 year old male referred for physical deconditioning. Patient reports that he was doing PT in [place] in 2022. Patient reports having trouble with balance. He reports difficulty with transferring and gait stability. 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 Patient reports difficulty with getting up (his wife states that all seats are low relative to patient's height). Patient's wife feels that his strength got worse following COVID-19 infection. Pt denies difficulty with SOB/aerobic endurance. Patient reports he had lumbar spine injury in 2015 - patient reports having difficulty with walking since then. Patient reports no vertigo/dizziness. Pt reports no major visual deficits - wife reports he does have cataracts. One fall in last 6 months (Sept 2022); fell going down back steps (patio in back of home - low lying handrail). Patient reports Hx of prostate cancer and history of prostatectomy.  Patient has 3 steps to get into home from front porch with single banister. Garage entrance has 3 steps to get in also with banister on either side. Home is one level. Pt has walk-in shower; pt has grab bars, textured bathroom floor. Patient has to  traverse concrete only to get into home. Pt feels that he has to have handrail to traverse stairs. Pt reports that since having lumbar radiculopathy, he drag his R toes sometimes. He reports bilateral numbness in his feet without specific digits/toes involved. Patient reports hx of R knee OA and MD has recommended TKA.   Recent changes in overall health/medication: Yes, COVID-19 infection in 2022; pt reports worsening weakness since then.    Limitations Walking;House hold activities    Patient Stated Goals able to walk better, able to get out of chair more readily                 [facility] PT Assessment - 12/28/21 1458       Assessment   Medical Diagnosis Physical deconditioning    Referring Provider (PT) Larene Beach H. Coward    Onset Date/Surgical Date 07/22/21    Next MD Visit None stated    Prior Therapy Yes, for same condition in 2022      Precautions   Precautions Fall      Balance Screen   Has the patient fallen in the past 6 months Yes    Has the patient had a decrease in activity level because of a fear of falling?  Yes    Is the patient reluctant to leave their home because of a fear of falling?  No      Prior Function   Level of Independence Independent with community mobility without device      Cognition   Overall Cognitive Status Within Functional Limits for tasks assessed      Berg Balance Test   Sit to Stand Able to stand  independently using hands    Standing Unsupported Able to stand safely 2 minutes    Sitting with Back Unsupported but Feet Supported on Floor or Stool Able to sit safely and securely 2 minutes    Stand to Sit Sits safely with minimal use of hands    Transfers Able to transfer safely, definite need of hands    Standing Unsupported with Eyes Closed Able to stand 3 seconds    Standing Unsupported with Feet Together Able to place feet together independently and stand for 1 minute with supervision    From Standing, Reach Forward with Outstretched Arm Can reach forward >12 cm safely (5")    From Standing Position, Pick up Object from [place] to pick up shoe safely and easily    From Standing Position, Turn to Look Behind Over each Shoulder Looks behind from both sides and weight shifts well    Turn 360 Degrees Able to turn 360 degrees safely but slowly    Standing Unsupported, Alternately Place Feet on Step/Stool Able to complete >2 steps/needs minimal assist    Standing Unsupported, One Foot in Front Able to take small step independently and hold 30 seconds    Standing on One Leg Tries to lift leg/unable to  hold 3 seconds but remains standing independently    Total Score 40              SUBJECTIVE Chief complaint: Patient is a 71 year old male referred for physical deconditioning. Patient reports that he was doing PT in [place] in 2022. Patient reports having trouble with balance. He reports difficulty with transferring and gait stability.   Onset:  Patient is a 71 year old male referred for physical deconditioning. Patient reports that he was doing PT in [place] in 2022. Patient reports having trouble with balance. He reports difficulty with  transferring and gait stability. 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 Patient reports difficulty with getting up (his wife states that all seats are low relative to patient's height). Patient's wife feels that his strength got worse following COVID-19 infection. Pt denies difficulty with SOB/aerobic endurance. Patient reports he had lumbar spine injury in 2015 - patient reports having difficulty with walking since then. Patient reports no vertigo/dizziness. Pt reports no major visual deficits - wife reports he does have cataracts. One fall in last 6 months (Sept 2022); fell going down back steps (patio in back of home - low lying handrail). Patient reports Hx of prostate cancer and history of prostatectomy.  Patient has 3 steps to get into home from front porch with single banister. Garage entrance has 3 steps to get in also with banister on either side. Home is one level. Pt has walk-in shower; pt has grab bars, textured bathroom floor. Patient has to traverse concrete only to get into home. Pt feels that he has to have handrail to traverse stairs. Pt reports that since having lumbar radiculopathy, he drag his R toes sometimes. He reports bilateral numbness in his feet without specific digits/toes involved. Patient reports hx of R knee OA and MD has recommended TKA.  Recent changes in overall health/medication: Yes, COVID-19 infection in  2022; pt reports worsening weakness since then. Directional pattern for falls: None Prior history of physical therapy for balance: Yes, Nov-Dec 2022 Follow-up appointment with MD: None scheduled Red flags (bowel/bladder changes, saddle paresthesia, personal history of cancer, chills/fever, night sweats, unrelenting pain) Negative  Patient goals: able to walk better, able to get out of chair more readily   OBJECTIVE  MUSCULOSKELETAL: Tremor: Absent Bulk: Normal Tone: Normal, no clonus  Posture Forward head rounded shoulders posture, moderate bilateral pes planus  Gait Genu varum noted in bilat LE with shortened single-limb support time and shortened step length with rapid cadence, poor force attenuation at loading response  Strength R/L 4-/4+ Hip flexion 5/5 Hip abduction (seated) 5/5 Hip adduction 5/5 Knee extension 5/5 Knee flexion 4/4 Ankle Plantarflexion 2/4+ Ankle Dorsiflexion   NEUROLOGICAL:  Mental Status Patient is oriented to person, place and time.  Recent memory is intact.  Remote memory is intact.  Attention span and concentration are intact.  Expressive speech is intact.  Patient's fund of knowledge is within normal limits for educational level.  Cranial Nerves Visual acuity and visual fields are intact  Extraocular muscles are intact  Facial sensation is intact bilaterally  Facial strength is intact bilaterally  Hearing is normal as tested by gross conversation Palate elevates midline, normal phonation  Shoulder shrug strength is intact  Tongue protrudes midline   Sensation Grossly intact to light touch bilateral UEs/LEs as determined by testing dermatomes C2-T2/L2-S2 respectively, only mild sensory loss L medial malleolus  Proprioception and hot/cold testing deferred on this date   Coordination/Cerebellar Finger to Nose: WNL Heel to Shin: WNL Dysdiadochokinesia: WNL UE and LE  Finger Opposition: WNL Pronator Drift: Mild decreased pronation  LUE    FUNCTIONAL OUTCOME MEASURES   Results Comments  BERG 40/56 Fall risk, in need of intervention  TUG 17.5 seconds   5TSTS 56 seconds   ABC Scale 56.25%     FUNCTIONAL TASKS  Sit to stand: Heavy UE support on armrests with slow ascent following lifting pelvis from seat, mild posterior imbalance following completion of transfer    TREATMENT  Therapeutic Exercise - for HEP establishment, PT  Education, fall risk management   Reviewed baseline home exercises and provided handout for [place] program (see Access Code); reviewed fall risk management education.       ASSESSMENT Clinical Impression: Pt is a pleasant 71 year old male referred for physical deconditioning with primary complaint of lower extremity weakness and difficulty with balance. PT examination reveals deficits in static postural control, transferring, hip flexor and dorsiflexor/plantarflexor strength (RLE more affected), gait deviations, limited R foot dorsiflexion AROM, difficulty with sit to stand and stair negotiation. Pt presents with deficits in strength, gait and balance. Pt will benefit from skilled PT services to address deficits in balance and decrease risk for future falls.     PT Short Term Goals - 12/28/21 1532       PT SHORT TERM GOAL #1   Title Pt will be independent with HEP in order to improve strength and balance in order to decrease fall risk and improve function at home and work.    Baseline 12/28/21: Baseline HEP initiated    Time 3    Period Weeks    Status New    Target Date 01/18/22      PT SHORT TERM GOAL #2   Title Patient will perform independent sit to stand from standard-height chair without upper extremity support required to initiate transfer as needed for functional mobility to access home and community    Baseline 12/28/21: UE assist required for sit to stand with intermittent incompletion of transfer    Time 4    Period Weeks    Status New    Target Date 01/25/22                PT Long Term Goals - 12/28/21 1536       PT LONG TERM GOAL #1   Title Pt will improve BERG to 45 org greater in order to demonstrate clinically significant improvement in balance and to meet cut-off score indicative of no increased risk of falls    Baseline 12/28/21: BERG 40/56.    Time 8    Period Weeks    Status New    Target Date 02/24/22      PT LONG TERM GOAL #2   Title Pt will decrease TUG to below 14 seconds/decrease in order to demonstrate decreased fall risk.    Baseline 12/28/21: 17.5 sec.    Time 8    Period Weeks    Status New    Target Date 02/24/22      PT LONG TERM GOAL #3   Title Pt will improve ABC by at least 13% in order to demonstrate clinically significant improvement in balance confidence.    Baseline ABC Scale: 56.25%    Time 8    Period Weeks    Status New    Target Date 02/24/22      PT LONG TERM GOAL #4   Title Pt will decrease 5TSTS by at least 3 seconds in order to demonstrate clinically significant improvement in LE strength.    Baseline 12/28/21: 56 sec    Time 8    Period Weeks    Status New    Target Date 02/24/22      PT LONG TERM GOAL #5   Title Patient will be able to complete full church service with choir participation/singing without having to use nearby seat/support for postural control and no LOB    Baseline 12/28/21:Difficulty with maintaining balance, having to grab nearby seat for maintaining standing    Time 8  Period Weeks    Target Date 02/24/22                    Plan - 12/28/21 1524     Clinical Impression Statement Pt is a pleasant 71 year old male referred for physical deconditioning with primary complaint of lower extremity weakness and difficulty with balance. PT examination reveals deficits in static postural control, transferring, hip flexor and dorsiflexor/plantarflexor strength (RLE more affected), gait deviations, limited R foot dorsiflexion AROM, difficulty with sit to stand and stair  negotiation. Pt presents with deficits in strength, gait and balance. Pt will benefit from skilled PT services to address deficits in balance and decrease risk for future falls.    Personal Factors and Comorbidities Age;Comorbidity 3+;Past/Current Experience    Comorbidities aortic atherosclerosis, hyperlipidemia, HTN, CHF, PVD, hx of prostate cancer, OA, osteopenia    Examination-Activity Limitations Stairs;Stand;Transfers;Locomotion Level;Squat    Examination-Participation Restrictions Church;Cleaning;Community Activity   pt participates in choir and has to hold onto nearby item for balance   Stability/Clinical Decision Making Evolving/Moderate complexity    Clinical Decision Making Moderate    Rehab Potential Good    PT Frequency 2x / week    PT Duration 8 weeks    PT Treatment/Interventions ADLs/Self Care Home Management;Electrical Stimulation;Cryotherapy;Moist Heat;Gait training;Stair training;Functional mobility training;Therapeutic activities;Therapeutic exercise;Balance training;Neuromuscular re-education;Patient/family education    PT Next Visit Plan Postural control training, foot clearance training, weight shifting activities, LE strengthening    PT Home Exercise Plan Access Code [email or web address]    Consulted and Agree with Plan of Care Patient             Patient will benefit from skilled therapeutic intervention in order to improve the following deficits and impairments:  Abnormal gait, Decreased balance, Difficulty walking, Decreased strength  Visit Diagnosis: Physical deconditioning - Plan: PT plan of care cert/re-cert  Imbalance - Plan: PT plan of care cert/re-cert  Difficulty in walking, not elsewhere classified - Plan: PT plan of care cert/re-cert  Muscle weakness (generalized) - Plan: PT plan of care cert/re-cert     Problem List There are no problems to display for this patient.  Valentina Gu, PT, DPT #[id number]  Eilleen Kempf, PT 12/28/2021, 4:01 PM  [facility] [address] [place], [place], [address] Phone: [phone]   Fax:  [phone]  Name: Chad Norman MRN: [id number] Date of Birth: 1951-09-15

## 2021-12-30 ENCOUNTER — Other Ambulatory Visit: Payer: Self-pay

## 2021-12-30 ENCOUNTER — Encounter: Payer: Self-pay | Admitting: Physical Therapy

## 2021-12-30 ENCOUNTER — Ambulatory Visit: Payer: Medicare Other | Admitting: Physical Therapy

## 2021-12-30 DIAGNOSIS — R262 Difficulty in walking, not elsewhere classified: Secondary | ICD-10-CM

## 2021-12-30 DIAGNOSIS — R5381 Other malaise: Secondary | ICD-10-CM

## 2021-12-30 DIAGNOSIS — M6281 Muscle weakness (generalized): Secondary | ICD-10-CM

## 2021-12-30 DIAGNOSIS — R2689 Other abnormalities of gait and mobility: Secondary | ICD-10-CM

## 2021-12-30 NOTE — Therapy (Signed)
[facility] [address] [place], [place], [address] Phone: [phone]   Fax:  [phone]  Physical Therapy Treatment  Patient Details  Name: Chad Norman MRN: [id number] Date of Birth: 07/07/1951 Referring Provider (PT): [name]   Encounter Date: 12/30/2021   PT End of Session - 12/31/21 1011     Visit Number 2    Number of Visits 17    Date for PT Re-Evaluation 02/24/22    Authorization Type U[employer] Medicare    Authorization Time Period VL based on medical necessity    Progress Note Due on Visit 10    PT Start Time 1346    PT Stop Time 1429    PT Time Calculation (min) 43 min    Equipment Utilized During Treatment Gait belt    Activity Tolerance Patient tolerated treatment well    Behavior During Therapy WFL for tasks assessed/performed             Past Medical History:  Diagnosis Date   Aortic atherosclerosis ([place])    B12 deficiency    HLD (hyperlipidemia)    Hypertension    Hypertensive heart disease with chronic diastolic congestive heart failure ([place])    Lymphocytic colitis 2021   OSA on CPAP    Osteoarthritis    Osteopenia    Prediabetes    Prostate cancer ([place])    PVD (peripheral vascular disease) ([place])    Renal cyst, left     Past Surgical History:  Procedure Laterality Date   COLONOSCOPY N/A 09/07/2020   Procedure: COLONOSCOPY;  Surgeon: [name], MD;  Location: [facility] ENDOSCOPY;  Service: Endoscopy;  Laterality: N/A;   CYSTOSCOPY MACROPLASTIQUE IMPLANT N/A 07/15/2021   Procedure: CYSTOSCOPY MACROPLASTIQUE IMPLANT;  Surgeon: [name], MD;  Location: [facility] ORS;  Service: Urology;  Laterality: N/A;   KNEE ARTHROSCOPY Right 1999   PROSTATE SURGERY N/A 8127   RALP   UMBILICAL HERNIA REPAIR N/A 04/2019    There were no vitals filed for this visit.   Subjective Assessment - 12/31/21 1010     Subjective Patient reports feeling generally well at arrival to PT. He reports  tolerating initial evaluation well without significant complaints. He reports completing his initial home exercises as directed at IE.    Pertinent History Patient is a 71 year old male referred for physical deconditioning. Patient reports that he was doing PT in [place] in 2022. Patient reports having trouble with balance. He reports difficulty with transferring and gait stability. 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 Patient reports difficulty with getting up (his wife states that all seats are low relative to patient's height). Patient's wife feels that his strength got worse following COVID-19 infection. Pt denies difficulty with SOB/aerobic endurance. Patient reports he had lumbar spine injury in 2015 - patient reports having difficulty with walking since then. Patient reports no vertigo/dizziness. Pt reports no major visual deficits - wife reports he does have cataracts. One fall in last 6 months (Sept 2022); fell going down back steps (patio in back of home - low lying handrail). Patient reports Hx of prostate cancer and history of prostatectomy.  Patient has 3 steps to get into home from front porch with single banister. Garage entrance has 3 steps to get in also with banister on either side. Home is one level. Pt has walk-in shower; pt has grab bars, textured bathroom floor. Patient has to traverse concrete only to get into  home. Pt feels that he has to have handrail to traverse stairs. Pt reports that since having lumbar radiculopathy, he drag his R toes sometimes. He reports bilateral numbness in his feet without specific digits/toes involved. Patient reports hx of R knee OA and MD has recommended TKA.   Recent changes in overall health/medication: Yes, COVID-19 infection in 2022; pt reports worsening weakness since then.    Limitations Walking;House hold activities    Patient Stated Goals able to walk better, able to get out of chair more readily               TREATMENT   Neuromuscular Re-education - for improved sensory integration, static and dynamic postural control, equilibrium and non-equilibrium coordination as needed for negotiating home and community environment and stepping over obstacles  Adjacent to treadmill armrest for UE support prn: Toe tap to 6-inch step; RUE support on treadmill armrest; 2x10, alternating Romberg Stand; 2x30sec Romberg stand on foam; 2x20 sec, bilaterally (RUE support as needed)  // bars: Dynamic march; 3x D/B length of parallel bars    Therapeutic Activities - patient education, repetitive task practice for improved performance of daily functional activities e.g. transferring  Stair ascent and descent x 1 with bilateral UE support on handrails, x1 with unilateral support on R; noted safe ascent and descent and bilateral and unilateral upper limb support today with self-selected step-to pattern with LLE leading during ascent, RLE leading on descent    Therapeutic Exercise - improved strength as needed to improve performance of CKC activities/functional movements and as needed for power production to prevent fall during episode of large postural perturbation  Sit to stand; from 90 deg knee flexion, using low mat; 2x8  Adjacent to treadmill armrest for UE support:  Standing heel raise/toe raise; 2x10, alternating   NuStep, 5 minutes; L3 - interval subjective gathered during this time, 3 minutes unbilled   ASSESSMENT Patient arrives with excellent motivation to participate in physical therapy. Patient is able to maintain standing Romberg on non-compliant surface without significant LOB. He does have difficulty with full weight acceptance/unipedal stance without at least unilateral upper limb support. Patient demonstrates safe stair negotiation with at least unilateral UE support as needed for entering/exiting home. Patient has remaining deficits in static postural control, transferring, hip flexor and  dorsiflexor/plantarflexor strength (RLE more affected), gait deviations, limited R foot dorsiflexion AROM, difficulty with sit to stand and stair negotiation Patient will benefit from continued skilled therapeutic intervention to address the above deficits as needed for improved function and QoL.       PT Short Term Goals - 12/28/21 1532       PT SHORT TERM GOAL #1   Title Pt will be independent with HEP in order to improve strength and balance in order to decrease fall risk and improve function at home and work.    Baseline 12/28/21: Baseline HEP initiated    Time 3    Period Weeks    Status New    Target Date 01/18/22      PT SHORT TERM GOAL #2   Title Patient will perform independent sit to stand from [place] chair without upper extremity support required to initiate transfer as needed for functional mobility to access home and community    Baseline 12/28/21: UE assist required for sit to stand with intermittent incompletion of transfer    Time 4    Period Weeks    Status New    Target Date 01/25/22  PT Long Term Goals - 12/28/21 1536       PT LONG TERM GOAL #1   Title Pt will improve BERG to 45 org greater in order to demonstrate clinically significant improvement in balance and to meet cut-off score indicative of no increased risk of falls    Baseline 12/28/21: BERG 40/56.    Time 8    Period Weeks    Status New    Target Date 02/24/22      PT LONG TERM GOAL #2   Title Pt will decrease TUG to below 14 seconds/decrease in order to demonstrate decreased fall risk.    Baseline 12/28/21: 17.5 sec.    Time 8    Period Weeks    Status New    Target Date 02/24/22      PT LONG TERM GOAL #3   Title Pt will improve ABC by at least 13% in order to demonstrate clinically significant improvement in balance confidence.    Baseline ABC Scale: 56.25%    Time 8    Period Weeks    Status New    Target Date 02/24/22      PT LONG TERM GOAL #4   Title Pt will  decrease 5TSTS by at least 3 seconds in order to demonstrate clinically significant improvement in LE strength.    Baseline 12/28/21: 56 sec    Time 8    Period Weeks    Status New    Target Date 02/24/22      PT LONG TERM GOAL #5   Title Patient will be able to complete full church service with choir participation/singing without having to use nearby seat/support for postural control and no LOB    Baseline 12/28/21:Difficulty with maintaining balance, having to grab nearby seat for maintaining standing    Time 8    Period Weeks    Target Date 02/24/22                   Plan - 01/01/22 0853     Clinical Impression Statement Patient arrives with excellent motivation to participate in physical therapy. Patient is able to maintain standing Romberg on non-compliant surface without significant LOB. He does have difficulty with full weight acceptance/unipedal stance without at least unilateral upper limb support. Patient demonstrates safe stair negotiation with at least unilateral UE support as needed for entering/exiting home. Patient has remaining deficits in static postural control, transferring, hip flexor and dorsiflexor/plantarflexor strength (RLE more affected), gait deviations, limited R foot dorsiflexion AROM, difficulty with sit to stand and stair negotiation Patient will benefit from continued skilled therapeutic intervention to address the above deficits as needed for improved function and QoL.    Personal Factors and Comorbidities Age;Comorbidity 3+;Past/Current Experience    Comorbidities aortic atherosclerosis, hyperlipidemia, HTN, CHF, PVD, hx of prostate cancer, OA, osteopenia    Examination-Activity Limitations Stairs;Stand;Transfers;Locomotion Level;Squat    Examination-Participation Restrictions Church;Cleaning;Community Activity   pt participates in choir and has to hold onto nearby item for balance   Stability/Clinical Decision Making Evolving/Moderate complexity    Rehab  Potential Good    PT Frequency 2x / week    PT Duration 8 weeks    PT Treatment/Interventions ADLs/Self Care Home Management;Electrical Stimulation;Cryotherapy;Moist Heat;Gait training;Stair training;Functional mobility training;Therapeutic activities;Therapeutic exercise;Balance training;Neuromuscular re-education;Patient/family education    PT Next Visit Plan Postural control training, foot clearance training, weight shifting activities, LE strengthening. Update HEP with successive PT visits.    PT Home Exercise Plan Access Code [email or web address]  Consulted and Agree with Plan of Care Patient             Patient will benefit from skilled therapeutic intervention in order to improve the following deficits and impairments:  Abnormal gait, Decreased balance, Difficulty walking, Decreased strength  Visit Diagnosis: Physical deconditioning  Imbalance  Difficulty in walking, not elsewhere classified  Muscle weakness (generalized)     Problem List There are no problems to display for this patient.  Valentina Gu, PT, DPT #[id number]  Eilleen Kempf, PT 01/01/2022, 8:57 AM  [facility] [address] [place], [place], [address] Phone: [phone]   Fax:  [phone]  Name: Aliou Mealey MRN: [id number] Date of Birth: 07/12/1951

## 2022-01-04 ENCOUNTER — Ambulatory Visit: Payer: Medicare Other

## 2022-01-04 ENCOUNTER — Other Ambulatory Visit: Payer: Self-pay

## 2022-01-04 DIAGNOSIS — R5381 Other malaise: Secondary | ICD-10-CM | POA: Diagnosis not present

## 2022-01-04 DIAGNOSIS — R262 Difficulty in walking, not elsewhere classified: Secondary | ICD-10-CM

## 2022-01-04 DIAGNOSIS — R2689 Other abnormalities of gait and mobility: Secondary | ICD-10-CM

## 2022-01-04 NOTE — Therapy (Signed)
[facility] [address] [place], [place], [address] Phone: [phone]   Fax:  [phone]  Physical Therapy Treatment  Patient Details  Name: Chad Norman MRN: [id number] Date of Birth: Jan 25, 1951 Referring Provider (PT): [name]   Encounter Date: 01/04/2022   PT End of Session - 01/04/22 1724     Visit Number 3    Number of Visits 17    Date for PT Re-Evaluation 02/24/22    Authorization Type UHC Medicare    Authorization Time Period VL based on medical necessity    Progress Note Due on Visit 10    PT Start Time 1520    PT Stop Time 1605    PT Time Calculation (min) 45 min    Equipment Utilized During Treatment Gait belt    Activity Tolerance Patient tolerated treatment well    Behavior During Therapy WFL for tasks assessed/performed             Past Medical History:  Diagnosis Date   Aortic atherosclerosis ([place])    B12 deficiency    HLD (hyperlipidemia)    Hypertension    Hypertensive heart disease with chronic diastolic congestive heart failure ([place])    Lymphocytic colitis 2021   OSA on CPAP    Osteoarthritis    Osteopenia    Prediabetes    Prostate cancer ([place])    PVD (peripheral vascular disease) ([place])    Renal cyst, left     Past Surgical History:  Procedure Laterality Date   COLONOSCOPY N/A 09/07/2020   Procedure: COLONOSCOPY;  Surgeon: [name], MD;  Location: [facility] ENDOSCOPY;  Service: Endoscopy;  Laterality: N/A;   CYSTOSCOPY MACROPLASTIQUE IMPLANT N/A 07/15/2021   Procedure: CYSTOSCOPY MACROPLASTIQUE IMPLANT;  Surgeon: [name], MD;  Location: [facility] ORS;  Service: Urology;  Laterality: N/A;   KNEE ARTHROSCOPY Right 1999   PROSTATE SURGERY N/A 1657   RALP   UMBILICAL HERNIA REPAIR N/A 04/2019    There were no vitals filed for this visit.   Subjective Assessment - 01/04/22 1722     Subjective Pt reports he feels like the sit to stand exercise is helping his  strength.  He continues to report having difficulty with sit to stand off his toilet- has to pull himself up with grabbing a rail in front of him.  He states he has had no falls since last session.  He continues to ascend/descend any stairs with a step-to pattern leading with his L LE up and R LE down.    Pertinent History Patient is a 71 year old male referred for physical deconditioning. Patient reports that he was doing PT in [place] in 2022. Patient reports having trouble with balance. He reports difficulty with transferring and gait stability. 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 Patient reports difficulty with getting up (his wife states that all seats are low relative to patient's height). Patient's wife feels that his strength got worse following COVID-19 infection. Pt denies difficulty with SOB/aerobic endurance. Patient reports he had lumbar spine injury in 2015 - patient reports having difficulty with walking since then. Patient reports no vertigo/dizziness. Pt reports no major visual deficits - wife reports he does have cataracts. One fall in last 6 months (Sept 2022); fell going down back steps (patio in back of home - low lying handrail). Patient reports Hx of prostate cancer and history of prostatectomy.  Patient has 3 steps to get into home  from front porch with single banister. Garage entrance has 3 steps to get in also with banister on either side. Home is one level. Pt has walk-in shower; pt has grab bars, textured bathroom floor. Patient has to traverse concrete only to get into home. Pt feels that he has to have handrail to traverse stairs. Pt reports that since having lumbar radiculopathy, he drag his R toes sometimes. He reports bilateral numbness in his feet without specific digits/toes involved. Patient reports hx of R knee OA and MD has recommended TKA.   Recent changes in overall health/medication: Yes, COVID-19 infection in 2022; pt reports worsening weakness  since then.    Limitations Walking;House hold activities    Patient Stated Goals able to walk better, able to get out of chair more readily               TREATMENT     Neuromuscular Re-education - for improved sensory integration, static and dynamic postural control, equilibrium and non-equilibrium coordination as needed for negotiating home and community environment and stepping over obstacles   Adjacent to treadmill armrest for UE support prn: Toe tap to 6-inch step; RUE support on treadmill armrest; 2x10, alternating Romberg Stand; 2x30sec Romberg stand on foam; 2x20 sec, bilaterally (RUE support as needed) Romberg stance on foam; Added head turns R/L x 10 ea, Up/down x10 ea   // bars: Dynamic march; 3x D/B length of parallel bars- not today       Therapeutic Activities - patient education, repetitive task practice for improved performance of daily functional activities e.g. transferring  Pt education regarding how sit to stand correlates functionally with improving strength for transfer on/off the toilet.  Stair ascent and descent x 1 with bilateral UE support on handrails, x1 with unilateral support on R; self-selected step-to pattern with LLE leading during ascent, RLE leading on descent        Therapeutic Exercise - improved strength as needed to improve performance of CKC activities/functional movements and as needed for power production to prevent fall during episode of large postural perturbation   Sit to stand; from 90 deg knee flexion, using low mat; 2x10- pt requires 1 rest break after.   Adjacent to treadmill armrest for UE support:  Standing heel raise/toe raise; 2x10 Seated toe raises 2x10     NuStep, 5 minutes; L3 - interval subjective gathered during this time, 3 minutes unbilled           PT Short Term Goals - 12/28/21 1532       PT SHORT TERM GOAL #1   Title Pt will be independent with HEP in order to improve strength and balance in order  to decrease fall risk and improve function at home and work.    Baseline 12/28/21: Baseline HEP initiated    Time 3    Period Weeks    Status New    Target Date 01/18/22      PT SHORT TERM GOAL #2   Title Patient will perform independent sit to stand from [place] chair without upper extremity support required to initiate transfer as needed for functional mobility to access home and community    Baseline 12/28/21: UE assist required for sit to stand with intermittent incompletion of transfer    Time 4    Period Weeks    Status New    Target Date 01/25/22               PT Long Term Goals - 12/28/21 1536  PT LONG TERM GOAL #1   Title Pt will improve BERG to 45 org greater in order to demonstrate clinically significant improvement in balance and to meet cut-off score indicative of no increased risk of falls    Baseline 12/28/21: BERG 40/56.    Time 8    Period Weeks    Status New    Target Date 02/24/22      PT LONG TERM GOAL #2   Title Pt will decrease TUG to below 14 seconds/decrease in order to demonstrate decreased fall risk.    Baseline 12/28/21: 17.5 sec.    Time 8    Period Weeks    Status New    Target Date 02/24/22      PT LONG TERM GOAL #3   Title Pt will improve ABC by at least 13% in order to demonstrate clinically significant improvement in balance confidence.    Baseline ABC Scale: 56.25%    Time 8    Period Weeks    Status New    Target Date 02/24/22      PT LONG TERM GOAL #4   Title Pt will decrease 5TSTS by at least 3 seconds in order to demonstrate clinically significant improvement in LE strength.    Baseline 12/28/21: 56 sec    Time 8    Period Weeks    Status New    Target Date 02/24/22      PT LONG TERM GOAL #5   Title Patient will be able to complete full church service with choir participation/singing without having to use nearby seat/support for postural control and no LOB    Baseline 12/28/21:Difficulty with maintaining balance, having  to grab nearby seat for maintaining standing    Time 8    Period Weeks    Target Date 02/24/22                   Plan - 01/04/22 1725     Clinical Impression Statement Pt was challenged with progression of Romberg stance on airex with addition of head movements.  He demonstrates excess trunk sway laterally, but had no loss of balance.  R DF AROM continues to contribute to impaired gait pattern with excess hip/knee flexion strategy required to clear toes on R LE.  He required 1 rest break after sit to stand exercise.  He would benefit from continuing with LE strengthening exercises to improve standing tolerance, walking endurance, and ability to perform transfers.    Personal Factors and Comorbidities Age;Comorbidity 3+;Past/Current Experience    Comorbidities aortic atherosclerosis, hyperlipidemia, HTN, CHF, PVD, hx of prostate cancer, OA, osteopenia    Examination-Activity Limitations Stairs;Stand;Transfers;Locomotion Level;Squat    Examination-Participation Restrictions Church;Cleaning;Community Activity   pt participates in choir and has to hold onto nearby item for balance   Stability/Clinical Decision Making Evolving/Moderate complexity    Rehab Potential Good    PT Frequency 2x / week    PT Duration 8 weeks    PT Treatment/Interventions ADLs/Self Care Home Management;Electrical Stimulation;Cryotherapy;Moist Heat;Gait training;Stair training;Functional mobility training;Therapeutic activities;Therapeutic exercise;Balance training;Neuromuscular re-education;Patient/family education    PT Next Visit Plan Postural control training, foot clearance training, weight shifting activities, LE strengthening. Update HEP with successive PT visits.    PT Home Exercise Plan Access Code [email or web address]    Consulted and Agree with Plan of Care Patient             Patient will benefit from skilled therapeutic intervention in order to improve the following deficits and impairments:  Abnormal  gait,  Decreased balance, Difficulty walking, Decreased strength  Visit Diagnosis: Physical deconditioning  Imbalance  Difficulty in walking, not elsewhere classified     Problem List There are no problems to display for this patient.   Pincus Badder, PT 01/04/2022, 5:29 PM Merdis Delay, PT, DPT  [phone]  [facility] [address] [place], [place], [address] Phone: [phone]   Fax:  [phone]  Name: Chad Norman MRN: [id number] Date of Birth: August 18, 1951

## 2022-01-06 ENCOUNTER — Ambulatory Visit: Payer: Medicare Other

## 2022-01-06 ENCOUNTER — Other Ambulatory Visit: Payer: Self-pay

## 2022-01-06 DIAGNOSIS — R262 Difficulty in walking, not elsewhere classified: Secondary | ICD-10-CM

## 2022-01-06 DIAGNOSIS — M6281 Muscle weakness (generalized): Secondary | ICD-10-CM

## 2022-01-06 DIAGNOSIS — R5381 Other malaise: Secondary | ICD-10-CM | POA: Diagnosis not present

## 2022-01-06 DIAGNOSIS — R2689 Other abnormalities of gait and mobility: Secondary | ICD-10-CM

## 2022-01-06 DIAGNOSIS — R2681 Unsteadiness on feet: Secondary | ICD-10-CM

## 2022-01-06 NOTE — Therapy (Addendum)
[facility] [address] [place], [place], [address] Phone: [phone]   Fax:  [phone]  Physical Therapy Treatment  Patient Details  Name: Chad Norman MRN: [id number] Date of Birth: [birth year] Referring Provider (PT): [name]   Encounter Date: 01/06/2022   PT End of Session - 01/06/22 1320     Visit Number 4    Number of Visits 17    Date for PT Re-Evaluation 02/24/22    Authorization Type U[employer] Medicare    Authorization Time Period VL based on medical necessity    Progress Note Due on Visit 10    PT Start Time 1315    PT Stop Time 1355    PT Time Calculation (min) 40 min    Equipment Utilized During Treatment Gait belt    Activity Tolerance Patient tolerated treatment well    Behavior During Therapy WFL for tasks assessed/performed             Past Medical History:  Diagnosis Date   Aortic atherosclerosis ([place])    B12 deficiency    HLD (hyperlipidemia)    Hypertension    Hypertensive heart disease with chronic diastolic congestive heart failure ([place])    Lymphocytic colitis 2021   OSA on CPAP    Osteoarthritis    Osteopenia    Prediabetes    Prostate cancer ([place])    PVD (peripheral vascular disease) ([place])    Renal cyst, left     Past Surgical History:  Procedure Laterality Date   COLONOSCOPY N/A 09/07/2020   Procedure: COLONOSCOPY;  Surgeon: [name], MD;  Location: [facility] ENDOSCOPY;  Service: Endoscopy;  Laterality: N/A;   CYSTOSCOPY MACROPLASTIQUE IMPLANT N/A 07/15/2021   Procedure: CYSTOSCOPY MACROPLASTIQUE IMPLANT;  Surgeon: [name], MD;  Location: [facility] ORS;  Service: Urology;  Laterality: N/A;   KNEE ARTHROSCOPY Right 1999   PROSTATE SURGERY N/A 8250   RALP   UMBILICAL HERNIA REPAIR N/A 04/2019    There were no vitals filed for this visit.   Subjective Assessment - 01/06/22 1317     Subjective Pt reports feeling good upon arrival. Pt states that when laying in  bed at night he has pain in his L shoulder and hip from where he fell on them. Pt reports that the sit to stands at home have been feeling better.    Pertinent History Patient is a 71 year old male referred for physical deconditioning. Patient reports that he was doing PT in [place] in 2022. Patient reports having trouble with balance. He reports difficulty with transferring and gait stability. 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 Patient reports difficulty with getting up (his wife states that all seats are low relative to patient's height). Patient's wife feels that his strength got worse following COVID-19 infection. Pt denies difficulty with SOB/aerobic endurance. Patient reports he had lumbar spine injury in 2015 - patient reports having difficulty with walking since then. Patient reports no vertigo/dizziness. Pt reports no major visual deficits - wife reports he does have cataracts. One fall in last 6 months (Sept 2022); fell going down back steps (patio in back of home - low lying handrail). Patient reports Hx of prostate cancer and history of prostatectomy.  Patient has 3 steps to get into home from front porch with single banister. Garage entrance has 3 steps to get in also with banister on either side. Home is one level. Pt has walk-in shower; pt has  grab bars, textured bathroom floor. Patient has to traverse concrete only to get into home. Pt feels that he has to have handrail to traverse stairs. Pt reports that since having lumbar radiculopathy, he drag his R toes sometimes. He reports bilateral numbness in his feet without specific digits/toes involved. Patient reports hx of R knee OA and MD has recommended TKA.   Recent changes in overall health/medication: Yes, COVID-19 infection in 2022; pt reports worsening weakness since then.    Limitations Walking;House hold activities    Patient Stated Goals able to walk better, able to get out of chair more readily                 TREATMENT     Neuromuscular Re-education - for improved sensory integration, static and dynamic postural control, equilibrium and non-equilibrium coordination as needed for negotiating home and community environment and stepping over obstacles   Toe tap to 6" step on stairs BUE support 2 x 10 BLE Balance on airex pad; feet shoulder width w/eyes open, feet together, feet shoulder width w/eyes closed 2 x 30s each   *Not done today* Adjacent to treadmill armrest for UE support prn: Romberg Stand; 2x30sec Romberg stand on foam; 2x20 sec, bilaterally (RUE support as needed) Romberg stance on foam; Added head turns R/L x 10 ea, Up/down x10 ea // bars: Dynamic march; 3x D/B length of parallel bars- not today       Therapeutic Activities - patient education, repetitive task practice for improved performance of daily functional activities e.g. transferring  Stair ascent and descent x 3 with bilateral UE support on handrails        Therapeutic Exercise - improved strength as needed to improve performance of CKC activities/functional movements and as needed for power production to prevent fall during episode of large postural perturbation   Nustep L3 x 59min while reviewing interim history // bars: Standing marching w/3# AW 2 x 10 BLE // bars: Forward and backwards walking w/3# AW w/no UE support x 4 lengths each direction // bars: Lateral walking w/3# AW w/no UE support x 3 lengths each direction // bars: Lateral walking on long airex pad w/BUE support x 3 lengths each direction Sit to stand from low mat, 90 deg hip and knee flexion 2 x 10   *Not done today* Standing heel raise/toe raise; 2x10 Seated toe raises 2x10      Focused session on general LE strengthening, balance and overall endurance. For alternating toe taps on the 6" stair step pt required cueing in order to lift the foot off the step instead of dragging it off the step. Pt expressed mild discomfort with  standing marches in the // bars when his R knee bent up too high. Instructed pt to only bring his knee up half as high, which resulted in relief of the discomfort. Pt's balance on airex pad with both feet apart and together looked good so progressed to eyes closed. Pt appeared unsteady and exhibited slight swaying while on airex pad with eyes closed and feet shoulder width apart, but no LOB occurred. Practiced more sit to stands from lowered mat table. Pt expressed mild difficulty, but was able to complete 2 sets of 10 with hands on his knees for support. Pt would benefit from continued PT services to address deficits in strength and balance in order to return to full function at home and decrease risk of future falls.       PT Short Term Goals - 12/28/21  [place] #1   Title Pt will be independent with HEP in order to improve strength and balance in order to decrease fall risk and improve function at home and work.    Baseline 12/28/21: Baseline HEP initiated    Time 3    Period Weeks    Status New    Target Date 01/18/22      PT SHORT TERM GOAL #2   Title Patient will perform independent sit to stand from standard-height chair without upper extremity support required to initiate transfer as needed for functional mobility to access home and community    Baseline 12/28/21: UE assist required for sit to stand with intermittent incompletion of transfer    Time 4    Period Weeks    Status New    Target Date 01/25/22               PT Long Term Goals - 12/28/21 1536       PT LONG TERM GOAL #1   Title Pt will improve BERG to 45 org greater in order to demonstrate clinically significant improvement in balance and to meet cut-off score indicative of no increased risk of falls    Baseline 12/28/21: BERG 40/56.    Time 8    Period Weeks    Status New    Target Date 02/24/22      PT LONG TERM GOAL #2   Title Pt will decrease TUG to below 14 seconds/decrease in order to  demonstrate decreased fall risk.    Baseline 12/28/21: 17.5 sec.    Time 8    Period Weeks    Status New    Target Date 02/24/22      PT LONG TERM GOAL #3   Title Pt will improve ABC by at least 13% in order to demonstrate clinically significant improvement in balance confidence.    Baseline ABC Scale: 56.25%    Time 8    Period Weeks    Status New    Target Date 02/24/22      PT LONG TERM GOAL #4   Title Pt will decrease 5TSTS by at least 3 seconds in order to demonstrate clinically significant improvement in LE strength.    Baseline 12/28/21: 56 sec    Time 8    Period Weeks    Status New    Target Date 02/24/22      PT LONG TERM GOAL #5   Title Patient will be able to complete full church service with choir participation/singing without having to use nearby seat/support for postural control and no LOB    Baseline 12/28/21:Difficulty with maintaining balance, having to grab nearby seat for maintaining standing    Time 8    Period Weeks    Target Date 02/24/22                   Plan - 01/06/22 1321     Clinical Impression Statement Focused session on general LE strengthening, balance and overall endurance. For alternating toe taps on the 6" stair step pt required cueing in order to lift the foot off the step instead of dragging it off the step. Pt expressed mild discomfort with standing marches in the // bars when his R knee bent up too high. Instructed pt to only bring his knee up half as high, which resulted in relief of the discomfort. Pt's balance on airex pad with both feet apart and  together looked good so progressed to eyes closed. Pt appeared unsteady and exhibited slight swaying while on airex pad with eyes closed and feet shoulder width apart, but no LOB occurred. Practiced more sit to stands from lowered mat table. Pt expressed mild difficulty, but was able to complete 2 sets of 10 with hands on his knees for support. Pt would benefit from continued PT services to  address deficits in strength and balance in order to return to full function at home and decrease risk of future falls.    Personal Factors and Comorbidities Age;Comorbidity 3+;Past/Current Experience    Comorbidities aortic atherosclerosis, hyperlipidemia, HTN, CHF, PVD, hx of prostate cancer, OA, osteopenia    Examination-Activity Limitations Stairs;Stand;Transfers;Locomotion Level;Squat    Examination-Participation Restrictions Church;Cleaning;Community Activity   pt participates in choir and has to hold onto nearby item for balance   Stability/Clinical Decision Making Evolving/Moderate complexity    Rehab Potential Good    PT Frequency 2x / week    PT Duration 8 weeks    PT Treatment/Interventions ADLs/Self Care Home Management;Electrical Stimulation;Cryotherapy;Moist Heat;Gait training;Stair training;Functional mobility training;Therapeutic activities;Therapeutic exercise;Balance training;Neuromuscular re-education;Patient/family education    PT Next Visit Plan Postural control training, foot clearance training, weight shifting activities, LE strengthening. Update HEP with successive PT visits.    PT Home Exercise Plan Access Code [email or web address]    Consulted and Agree with Plan of Care Patient             Patient will benefit from skilled therapeutic intervention in order to improve the following deficits and impairments:  Abnormal gait, Decreased balance, Difficulty walking, Decreased strength  Visit Diagnosis: Muscle weakness (generalized)  Difficulty in walking, not elsewhere classified  Unsteadiness on feet     Problem List There are no problems to display for this patient.  Chad Norman SPT Phillips Grout PT, DPT, GCS  Norman,Chad, PT 01/07/2022, 10:34 AM Merdis Delay, PT, DPT  [phone]  [facility] [address] [place], [place], [address] Phone: [phone]   Fax:  [phone]  Name: Osric Klopf MRN:  [id number] Date of Birth: 12-21-1950

## 2022-01-11 ENCOUNTER — Other Ambulatory Visit: Payer: Self-pay

## 2022-01-11 ENCOUNTER — Ambulatory Visit: Payer: Medicare Other

## 2022-01-11 DIAGNOSIS — R5381 Other malaise: Secondary | ICD-10-CM | POA: Diagnosis not present

## 2022-01-11 DIAGNOSIS — R262 Difficulty in walking, not elsewhere classified: Secondary | ICD-10-CM

## 2022-01-11 DIAGNOSIS — R2681 Unsteadiness on feet: Secondary | ICD-10-CM

## 2022-01-11 DIAGNOSIS — M6281 Muscle weakness (generalized): Secondary | ICD-10-CM

## 2022-01-11 NOTE — Therapy (Signed)
[facility] [address]. [place], [place], [address] Phone: [phone]   Fax:  [phone]  Physical Therapy Treatment  Patient Details  Name: Chad Norman MRN: [id number] Date of Birth: 09-01-1951 Referring Provider (PT): [name]   Encounter Date: 01/11/2022   PT End of Session - 01/11/22 1702     Visit Number 5    Number of Visits 17    Date for PT Re-Evaluation 02/24/22    Authorization Type UHC Medicare    Authorization Time Period VL based on medical necessity    Progress Note Due on Visit 10    PT Start Time 1515    PT Stop Time 1600    PT Time Calculation (min) 45 min    Equipment Utilized During Treatment Gait belt    Activity Tolerance Patient tolerated treatment well    Behavior During Therapy WFL for tasks assessed/performed             Past Medical History:  Diagnosis Date   Aortic atherosclerosis ([place])    B12 deficiency    HLD (hyperlipidemia)    Hypertension    Hypertensive heart disease with chronic diastolic congestive heart failure ([place])    Lymphocytic colitis 2021   OSA on CPAP    Osteoarthritis    Osteopenia    Prediabetes    Prostate cancer ([place])    PVD (peripheral vascular disease) ([place])    Renal cyst, left     Past Surgical History:  Procedure Laterality Date   COLONOSCOPY N/A 09/07/2020   Procedure: COLONOSCOPY;  Surgeon: [name], MD;  Location: [facility] ENDOSCOPY;  Service: Endoscopy;  Laterality: N/A;   CYSTOSCOPY MACROPLASTIQUE IMPLANT N/A 07/15/2021   Procedure: CYSTOSCOPY MACROPLASTIQUE IMPLANT;  Surgeon: [name], MD;  Location: [facility] ORS;  Service: Urology;  Laterality: N/A;   KNEE ARTHROSCOPY Right 1999   PROSTATE SURGERY N/A 9563   RALP   UMBILICAL HERNIA REPAIR N/A 04/2019    There were no vitals filed for this visit.   Subjective Assessment - 01/11/22 1510     Subjective Pt reports he feels like he is getting a little stronger with PT.  He  went to the gym and rode the bike yesterday.  Overall he feels ~50% improvement in being able to "get up and get down" from a seated position.    Pertinent History Patient is a 71 year old male referred for physical deconditioning. Patient reports that he was doing PT in [place] in 2022. Patient reports having trouble with balance. He reports difficulty with transferring and gait stability. 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 Patient reports difficulty with getting up (his wife states that all seats are low relative to patient's height). Patient's wife feels that his strength got worse following COVID-19 infection. Pt denies difficulty with SOB/aerobic endurance. Patient reports he had lumbar spine injury in 2015 - patient reports having difficulty with walking since then. Patient reports no vertigo/dizziness. Pt reports no major visual deficits - wife reports he does have cataracts. One fall in last 6 months (Sept 2022); fell going down back steps (patio in back of home - low lying handrail). Patient reports Hx of prostate cancer and history of prostatectomy.  Patient has 3 steps to get into home from front porch with single banister. Garage entrance has 3 steps to get in also with banister on either side. Home is one level. Pt has walk-in shower; pt has  grab bars, textured bathroom floor. Patient has to traverse concrete only to get into home. Pt feels that he has to have handrail to traverse stairs. Pt reports that since having lumbar radiculopathy, he drag his R toes sometimes. He reports bilateral numbness in his feet without specific digits/toes involved. Patient reports hx of R knee OA and MD has recommended TKA.   Recent changes in overall health/medication: Yes, COVID-19 infection in 2022; pt reports worsening weakness since then.    Limitations Walking;House hold activities    Patient Stated Goals able to walk better, able to get out of chair more readily               TREATMENT     Neuromuscular Re-education - for improved sensory integration, static and dynamic postural control, equilibrium and non-equilibrium coordination as needed for negotiating home and community environment and stepping over obstacles   Adjacent to stairs: Toe tap to 6-inch step; focused on unilateral hand support  Dynamic march; 3x D/B length of parallel bars (3# ankle weights)       Therapeutic Activities - patient education, repetitive task practice for improved performance of daily functional activities e.g. transferring    Stair ascent and descent x 2 with bilateral UE support on handrails, x1 with unilateral support on R; 1 with reciprocal gait pattern and 1 with step to gait pattern (leading up with L and down with R)         Therapeutic Exercise - improved strength as needed to improve performance of CKC activities/functional movements and as needed for power production to prevent fall during episode of large postural perturbation   Sit to stand; from 90 deg knee flexion, using low mat; 2x10- pt requires 1 rest break after.   Parallel bars: Standing heel raise/toe raise; 2x10 Seated toe raises 2x10 // bars: Standing marching w/3# AW 2 x 10 BLE // bars: Forward and backwards walking w/3# AW w/no UE support x 4 lengths each direction // bars: Lateral walking w/3# AW w/no UE support x 3 lengths each direction // bars: Lateral walking on long airex pad w/BUE support x 3 lengths each direction Sit to stand from chair with airex on seat 2 x 10, focused on not using UE support (gave pt a ball to hold)     NuStep, 5 minutes; L6 to start  then reduced to level 4 at 3:30; focused on achieving moderate intensity level and focused on using quads/LE mm                PT Short Term Goals - 12/28/21 1532       PT SHORT TERM GOAL #1   Title Pt will be independent with HEP in order to improve strength and balance in order to decrease fall risk and improve  function at home and work.    Baseline 12/28/21: Baseline HEP initiated    Time 3    Period Weeks    Status New    Target Date 01/18/22      PT SHORT TERM GOAL #2   Title Patient will perform independent sit to stand from [place] chair without upper extremity support required to initiate transfer as needed for functional mobility to access home and community    Baseline 12/28/21: UE assist required for sit to stand with intermittent incompletion of transfer    Time 4    Period Weeks    Status New    Target Date 01/25/22  PT Long Term Goals - 12/28/21 1536       PT LONG TERM GOAL #1   Title Pt will improve BERG to 45 org greater in order to demonstrate clinically significant improvement in balance and to meet cut-off score indicative of no increased risk of falls    Baseline 12/28/21: BERG 40/56.    Time 8    Period Weeks    Status New    Target Date 02/24/22      PT LONG TERM GOAL #2   Title Pt will decrease TUG to below 14 seconds/decrease in order to demonstrate decreased fall risk.    Baseline 12/28/21: 17.5 sec.    Time 8    Period Weeks    Status New    Target Date 02/24/22      PT LONG TERM GOAL #3   Title Pt will improve ABC by at least 13% in order to demonstrate clinically significant improvement in balance confidence.    Baseline ABC Scale: 56.25%    Time 8    Period Weeks    Status New    Target Date 02/24/22      PT LONG TERM GOAL #4   Title Pt will decrease 5TSTS by at least 3 seconds in order to demonstrate clinically significant improvement in LE strength.    Baseline 12/28/21: 56 sec    Time 8    Period Weeks    Status New    Target Date 02/24/22      PT LONG TERM GOAL #5   Title Patient will be able to complete full church service with choir participation/singing without having to use nearby seat/support for postural control and no LOB    Baseline 12/28/21:Difficulty with maintaining balance, having to grab nearby seat for  maintaining standing    Time 8    Period Weeks    Target Date 02/24/22                   Plan - 01/11/22 1702     Clinical Impression Statement Pt's R knee stiffness and weakness seems to be significantly contributing to his ability to perform standing LE strengthening, including sit to stands, mini squats, and step ups.  He shifts weight away from R knee during sit to stand and mini squats due to report of 5/10 knee pain.  He may benefit from continuing to progress quad/gluteal strengthening in positions that do not contribute to as much joint compressive forces at next session.    Personal Factors and Comorbidities Age;Comorbidity 3+;Past/Current Experience    Comorbidities aortic atherosclerosis, hyperlipidemia, HTN, CHF, PVD, hx of prostate cancer, OA, osteopenia    Examination-Activity Limitations Stairs;Stand;Transfers;Locomotion Level;Squat    Examination-Participation Restrictions Church;Cleaning;Community Activity   pt participates in choir and has to hold onto nearby item for balance   Stability/Clinical Decision Making Evolving/Moderate complexity    Rehab Potential Good    PT Frequency 2x / week    PT Duration 8 weeks    PT Treatment/Interventions ADLs/Self Care Home Management;Electrical Stimulation;Cryotherapy;Moist Heat;Gait training;Stair training;Functional mobility training;Therapeutic activities;Therapeutic exercise;Balance training;Neuromuscular re-education;Patient/family education    PT Next Visit Plan Postural control training, foot clearance training, weight shifting activities, LE strengthening. Update HEP with successive PT visits.    PT Home Exercise Plan Access Code [email or web address]    Consulted and Agree with Plan of Care Patient             Patient will benefit from skilled therapeutic intervention in order to improve the following  deficits and impairments:  Abnormal gait, Decreased balance, Difficulty walking, Decreased strength  Visit Diagnosis: Muscle  weakness (generalized)  Difficulty in walking, not elsewhere classified  Unsteadiness on feet     Problem List There are no problems to display for this patient.   Pincus Badder, PT 01/11/2022, 5:10 PM Merdis Delay, PT, DPT  [phone]  [facility] [address] [place], [place], [address] Phone: [phone]   Fax:  [phone]  Name: Chad Norman MRN: [id number] Date of Birth: [birth year]

## 2022-01-13 ENCOUNTER — Ambulatory Visit: Payer: Medicare Other

## 2022-01-13 ENCOUNTER — Encounter: Payer: Self-pay | Admitting: Physical Therapy

## 2022-01-13 ENCOUNTER — Other Ambulatory Visit: Payer: Self-pay

## 2022-01-13 DIAGNOSIS — R262 Difficulty in walking, not elsewhere classified: Secondary | ICD-10-CM

## 2022-01-13 DIAGNOSIS — R2681 Unsteadiness on feet: Secondary | ICD-10-CM

## 2022-01-13 DIAGNOSIS — M6281 Muscle weakness (generalized): Secondary | ICD-10-CM

## 2022-01-13 DIAGNOSIS — R2689 Other abnormalities of gait and mobility: Secondary | ICD-10-CM

## 2022-01-13 DIAGNOSIS — R5381 Other malaise: Secondary | ICD-10-CM | POA: Diagnosis not present

## 2022-01-13 NOTE — Therapy (Signed)
[facility] [address] [place], [place], [address] Phone: [phone]   Fax:  [phone]  Physical Therapy Treatment  Patient Details  Name: Chad Norman MRN: [id number] Date of Birth: 03-25-1951 Referring Provider (PT): [name]   Encounter Date: 01/13/2022   PT End of Session - 01/13/22 1512     Visit Number 6    Number of Visits 17    Date for PT Re-Evaluation 02/24/22    Authorization Type UHC Medicare    Authorization Time Period VL based on medical necessity    Progress Note Due on Visit 10    PT Start Time 1514    PT Stop Time 1557    PT Time Calculation (min) 43 min    Equipment Utilized During Treatment Gait belt    Activity Tolerance Patient tolerated treatment well    Behavior During Therapy WFL for tasks assessed/performed             Past Medical History:  Diagnosis Date   Aortic atherosclerosis ([place])    B12 deficiency    HLD (hyperlipidemia)    Hypertension    Hypertensive heart disease with chronic diastolic congestive heart failure ([place])    Lymphocytic colitis 2021   OSA on CPAP    Osteoarthritis    Osteopenia    Prediabetes    Prostate cancer ([place])    PVD (peripheral vascular disease) ([place])    Renal cyst, left     Past Surgical History:  Procedure Laterality Date   COLONOSCOPY N/A 09/07/2020   Procedure: COLONOSCOPY;  Surgeon: [name], MD;  Location: [facility] ENDOSCOPY;  Service: Endoscopy;  Laterality: N/A;   CYSTOSCOPY MACROPLASTIQUE IMPLANT N/A 07/15/2021   Procedure: CYSTOSCOPY MACROPLASTIQUE IMPLANT;  Surgeon: [name], MD;  Location: [facility] ORS;  Service: Urology;  Laterality: N/A;   KNEE ARTHROSCOPY Right 1999   PROSTATE SURGERY N/A 2536   RALP   UMBILICAL HERNIA REPAIR N/A 04/2019    There were no vitals filed for this visit.   Subjective Assessment - 01/13/22 1517     Subjective Pt continuing to endorse R knee pain and stiffness but no pain  currently. Ambulating with antalgic gait however.    Pertinent History Patient is a 71 year old male referred for physical deconditioning. Patient reports that he was doing PT in [place] in 2022. Patient reports having trouble with balance. He reports difficulty with transferring and gait stability. 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 Patient reports difficulty with getting up (his wife states that all seats are low relative to patient's height). Patient's wife feels that his strength got worse following COVID-19 infection. Pt denies difficulty with SOB/aerobic endurance. Patient reports he had lumbar spine injury in 2015 - patient reports having difficulty with walking since then. Patient reports no vertigo/dizziness. Pt reports no major visual deficits - wife reports he does have cataracts. One fall in last 6 months (Sept 2022); fell going down back steps (patio in back of home - low lying handrail). Patient reports Hx of prostate cancer and history of prostatectomy.  Patient has 3 steps to get into home from front porch with single banister. Garage entrance has 3 steps to get in also with banister on either side. Home is one level. Pt has walk-in shower; pt has grab bars, textured bathroom floor. Patient has to traverse concrete only to get into home. Pt feels that he has to have handrail to traverse  stairs. Pt reports that since having lumbar radiculopathy, he drag his R toes sometimes. He reports bilateral numbness in his feet without specific digits/toes involved. Patient reports hx of R knee OA and MD has recommended TKA.   Recent changes in overall health/medication: Yes, COVID-19 infection in 2022; pt reports worsening weakness since then.    Limitations Walking;House hold activities    Patient Stated Goals able to walk better, able to get out of chair more readily    Currently in Pain? No/denies             There.ex:   Nu-Step L4 for 5 minutes for UE/LE use for Le  strength and cardiopulmonary endurance.   Standing hip abduction with 3# AW's, 2x12/LE with UE support   Standing hip extension with 3# AW's, 2x12/LE with UE support. VC's for maintaining neutral lumbar positioning   Standing hip flexion with 3# AW's: 2x12/LE with UE support. VC's for eccentric control due and upright posture  Seated LAQ: R/L knee 5# AW's with 3 sec holds at contraction. 2x10/LE.   Neuro Re-Ed:   Toe-tap with 3 cones at // bars on floor: 2x5/LE with tapping color called by PT in various orders and 1-2 finger support. 3 cones x3 with each LE. CGA. Frequent VC's for reducing support to single finger.     Airex beam:    Lateral marches requiring BUE support for increased hip flexion and increased time in SLS: x8. CGA.   Forwards and lateral 6 hurdle navigation: x6 laps in // bars, CGA provided. With forward steps, x2 performed with step to pattern. X4 with step through pattern and SUE support. With side steps 2 finger support performed.   Tandem walking: x8 laps in // bars. X4 performed SUE support. X4 single finger support. CGA.   PT Education - 01/13/22 1511     Education Details Form/technique with exercise    Person(s) Educated Patient    Methods Explanation;Demonstration;Tactile cues;Verbal cues    Comprehension Verbalized understanding;Returned demonstration              PT Short Term Goals - 12/28/21 1532       PT SHORT TERM GOAL #1   Title Pt will be independent with HEP in order to improve strength and balance in order to decrease fall risk and improve function at home and work.    Baseline 12/28/21: Baseline HEP initiated    Time 3    Period Weeks    Status New    Target Date 01/18/22      PT SHORT TERM GOAL #2   Title Patient will perform independent sit to stand from standard-height chair without upper extremity support required to initiate transfer as needed for functional mobility to access home and community    Baseline 12/28/21: UE assist required  for sit to stand with intermittent incompletion of transfer    Time 4    Period Weeks    Status New    Target Date 01/25/22               PT Long Term Goals - 12/28/21 1536       PT LONG TERM GOAL #1   Title Pt will improve BERG to 45 org greater in order to demonstrate clinically significant improvement in balance and to meet cut-off score indicative of no increased risk of falls    Baseline 12/28/21: BERG 40/56.    Time 8    Period Weeks    Status New  Target Date 02/24/22      PT LONG TERM GOAL #2   Title Pt will decrease TUG to below 14 seconds/decrease in order to demonstrate decreased fall risk.    Baseline 12/28/21: 17.5 sec.    Time 8    Period Weeks    Status New    Target Date 02/24/22      PT LONG TERM GOAL #3   Title Pt will improve ABC by at least 13% in order to demonstrate clinically significant improvement in balance confidence.    Baseline ABC Scale: 56.25%    Time 8    Period Weeks    Status New    Target Date 02/24/22      PT LONG TERM GOAL #4   Title Pt will decrease 5TSTS by at least 3 seconds in order to demonstrate clinically significant improvement in LE strength.    Baseline 12/28/21: 56 sec    Time 8    Period Weeks    Status New    Target Date 02/24/22      PT LONG TERM GOAL #5   Title Patient will be able to complete full church service with choir participation/singing without having to use nearby seat/support for postural control and no LOB    Baseline 12/28/21:Difficulty with maintaining balance, having to grab nearby seat for maintaining standing    Time 8    Period Weeks    Target Date 02/24/22                   Plan - 01/13/22 1603     Clinical Impression Statement Performing strength exercises in [place] due to previous documentation stating R knee pain and stiffness limiting CKC exercise. Pt remained pain free with OKC. Pt continues to heavily rely on some form of S/BUE support with balance exercise requiring frequent VC's  to progress to either SUE support or 1-2 finger support. Pt demonstrating biggest balance difficulty with step over and single limb stance tasks requiring BUE support for stability. Pt will continue to benefit from skilled PT services to improve strength and balance to optimize function with ADL and church related tasks.    Personal Factors and Comorbidities Age;Comorbidity 3+;Past/Current Experience    Comorbidities aortic atherosclerosis, hyperlipidemia, HTN, CHF, PVD, hx of prostate cancer, OA, osteopenia    Examination-Activity Limitations Stairs;Stand;Transfers;Locomotion Level;Squat    Examination-Participation Restrictions Church;Cleaning;Community Activity   pt participates in choir and has to hold onto nearby item for balance   Stability/Clinical Decision Making Evolving/Moderate complexity    Rehab Potential Good    PT Frequency 2x / week    PT Duration 8 weeks    PT Treatment/Interventions ADLs/Self Care Home Management;Electrical Stimulation;Cryotherapy;Moist Heat;Gait training;Stair training;Functional mobility training;Therapeutic activities;Therapeutic exercise;Balance training;Neuromuscular re-education;Patient/family education    PT Next Visit Plan Postural control training, foot clearance training, weight shifting activities, LE strengthening. Update HEP with successive PT visits.    PT Home Exercise Plan Access Code [email or web address]    Consulted and Agree with Plan of Care Patient             Patient will benefit from skilled therapeutic intervention in order to improve the following deficits and impairments:  Abnormal gait, Decreased balance, Difficulty walking, Decreased strength  Visit Diagnosis: Muscle weakness (generalized)  Difficulty in walking, not elsewhere classified  Unsteadiness on feet  Physical deconditioning  Imbalance     Problem List There are no problems to display for this patient.   [place] Caster. Fairly IV, PT, DPT Physical  Therapist- [facility]  01/13/2022, 4:13 PM  [facility] [address] [place], [place], [address] Phone: [phone]   Fax:  [phone]  Name: Govind Furey MRN: [id number] Date of Birth: [birth year]

## 2022-01-18 ENCOUNTER — Other Ambulatory Visit: Payer: Self-pay

## 2022-01-18 ENCOUNTER — Encounter: Payer: Self-pay | Admitting: Physical Therapy

## 2022-01-18 ENCOUNTER — Ambulatory Visit: Payer: Medicare Other | Admitting: Physical Therapy

## 2022-01-18 DIAGNOSIS — R262 Difficulty in walking, not elsewhere classified: Secondary | ICD-10-CM

## 2022-01-18 DIAGNOSIS — M6281 Muscle weakness (generalized): Secondary | ICD-10-CM

## 2022-01-18 DIAGNOSIS — R2689 Other abnormalities of gait and mobility: Secondary | ICD-10-CM

## 2022-01-18 DIAGNOSIS — R5381 Other malaise: Secondary | ICD-10-CM | POA: Diagnosis not present

## 2022-01-18 NOTE — Therapy (Signed)
[facility] [address] [place], [place], [address] Phone: [phone]   Fax:  [phone]  Physical Therapy Treatment  Patient Details  Name: Chad Norman MRN: [id number] Date of Birth: [birth year] Referring Provider (PT): [name]   Encounter Date: 01/18/2022   PT End of Session - 01/18/22 1525     Visit Number 7    Number of Visits 17    Date for PT Re-Evaluation 02/24/22    Authorization Type UHC Medicare    Authorization Time Period VL based on medical necessity    Progress Note Due on Visit 10    PT Start Time 1520    PT Stop Time 1601    PT Time Calculation (min) 41 min    Equipment Utilized During Treatment Gait belt    Activity Tolerance Patient tolerated treatment well    Behavior During Therapy WFL for tasks assessed/performed             Past Medical History:  Diagnosis Date   Aortic atherosclerosis ([place])    B12 deficiency    HLD (hyperlipidemia)    Hypertension    Hypertensive heart disease with chronic diastolic congestive heart failure ([place])    Lymphocytic colitis 2021   OSA on CPAP    Osteoarthritis    Osteopenia    Prediabetes    Prostate cancer ([place])    PVD (peripheral vascular disease) ([place])    Renal cyst, left     Past Surgical History:  Procedure Laterality Date   COLONOSCOPY N/A 09/07/2020   Procedure: COLONOSCOPY;  Surgeon: [name], MD;  Location: [facility] ENDOSCOPY;  Service: Endoscopy;  Laterality: N/A;   CYSTOSCOPY MACROPLASTIQUE IMPLANT N/A 07/15/2021   Procedure: CYSTOSCOPY MACROPLASTIQUE IMPLANT;  Surgeon: [name], MD;  Location: [facility] ORS;  Service: Urology;  Laterality: N/A;   KNEE ARTHROSCOPY Right 1999   PROSTATE SURGERY N/A 0277   RALP   UMBILICAL HERNIA REPAIR N/A 04/2019    There were no vitals filed for this visit.   Subjective Assessment - 01/18/22 1523     Subjective Patient reports R knee feels "okay" at arrival to PT. Patient reports  tolerating previous session well. Patient reports that his ability to perform sit to stand and stairs has gotten better. Patient reports difficulty with getting up from toilet - has to use grab bar.    Pertinent History Patient is a 71 year old male referred for physical deconditioning. Patient reports that he was doing PT in [place] in 2022. Patient reports having trouble with balance. He reports difficulty with transferring and gait stability. 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 Patient reports difficulty with getting up (his wife states that all seats are low relative to patient's height). Patient's wife feels that his strength got worse following COVID-19 infection. Pt denies difficulty with SOB/aerobic endurance. Patient reports he had lumbar spine injury in 2015 - patient reports having difficulty with walking since then. Patient reports no vertigo/dizziness. Pt reports no major visual deficits - wife reports he does have cataracts. One fall in last 6 months (Sept 2022); fell going down back steps (patio in back of home - low lying handrail). Patient reports Hx of prostate cancer and history of prostatectomy.  Patient has 3 steps to get into home from front porch with single banister. Garage entrance has 3 steps to get in also with banister on either side. Home is one level. Pt has walk-in shower;  pt has grab bars, textured bathroom floor. Patient has to traverse concrete only to get into home. Pt feels that he has to have handrail to traverse stairs. Pt reports that since having lumbar radiculopathy, he drag his R toes sometimes. He reports bilateral numbness in his feet without specific digits/toes involved. Patient reports hx of R knee OA and MD has recommended TKA.   Recent changes in overall health/medication: Yes, COVID-19 infection in 2022; pt reports worsening weakness since then.    Limitations Walking;House hold activities    Patient Stated Goals able to walk better,  able to get out of chair more readily                There.ex:              Nu-Step L5 for 5 minutes for UE/LE use for Le strength and cardiopulmonary endurance.              Standing hip abduction with 4# AW's, 2x10/LE with UE support              Standing hip extension with 4# AW's, 2x10/LE with UE support. VC's for maintaining neutral lumbar positioning              Standing hip flexion with 4# AW's: 2x10/LE with UE support. VC's for eccentric control due and upright posture           *not today*     Seated LAQ: R/L knee 5# AW's with 3 sec holds at contraction. 2x10/LE.      Neuro Re-Ed:   Toe-tap with 3 cones at // bars on floor: x 1 minutes with alternating LE with tapping color called by PT in various orders and 1-2 finger support. 3 cones. CGA/supervision. Frequent VC's for reducing support to single finger.                 Airex beam:                          Lateral marches requiring BUE support for increased hip flexion and increased time in SLS: x8. CGA.               Forwards and lateral 6 hurdle navigation: x6 laps in // bars, CGA provided. With forward steps, x2 performed with step to pattern. X4 with step through pattern and SUE support. With side steps 2 finger support performed.    Tandem walking: x8 laps in // bars. X4 performed SUE support. X4 single finger support. CGA.     ASSESSMENT Patient arrives with excellent motivation to participate in physical therapy. Patient has remaining deficits in postural control, transferring/sit to stand, strength, and decreased R ankle dorsiflexion.He reports improvement in ability to perform stair negotiation and sit to stand. Continued work on toe clearance and balance re-training today without significant LOB. Patient will benefit from continued skilled therapeutic intervention to address the above deficits as needed for improved function and QoL.     PT Short Term Goals - 12/28/21 1532       PT SHORT TERM GOAL #1    Title Pt will be independent with HEP in order to improve strength and balance in order to decrease fall risk and improve function at home and work.    Baseline 12/28/21: Baseline HEP initiated    Time 3    Period Weeks    Status New    Target Date 01/18/22  PT SHORT TERM GOAL #2   Title Patient will perform independent sit to stand from standard-height chair without upper extremity support required to initiate transfer as needed for functional mobility to access home and community    Baseline 12/28/21: UE assist required for sit to stand with intermittent incompletion of transfer    Time 4    Period Weeks    Status New    Target Date 01/25/22               PT Long Term Goals - 12/28/21 1536       PT LONG TERM GOAL #1   Title Pt will improve BERG to 45 org greater in order to demonstrate clinically significant improvement in balance and to meet cut-off score indicative of no increased risk of falls    Baseline 12/28/21: BERG 40/56.    Time 8    Period Weeks    Status New    Target Date 02/24/22      PT LONG TERM GOAL #2   Title Pt will decrease TUG to below 14 seconds/decrease in order to demonstrate decreased fall risk.    Baseline 12/28/21: 17.5 sec.    Time 8    Period Weeks    Status New    Target Date 02/24/22      PT LONG TERM GOAL #3   Title Pt will improve ABC by at least 13% in order to demonstrate clinically significant improvement in balance confidence.    Baseline ABC Scale: 56.25%    Time 8    Period Weeks    Status New    Target Date 02/24/22      PT LONG TERM GOAL #4   Title Pt will decrease 5TSTS by at least 3 seconds in order to demonstrate clinically significant improvement in LE strength.    Baseline 12/28/21: 56 sec    Time 8    Period Weeks    Status New    Target Date 02/24/22      PT LONG TERM GOAL #5   Title Patient will be able to complete full church service with choir participation/singing without having to use nearby seat/support for  postural control and no LOB    Baseline 12/28/21:Difficulty with maintaining balance, having to grab nearby seat for maintaining standing    Time 8    Period Weeks    Target Date 02/24/22                   Plan - 01/18/22 2110     Clinical Impression Statement Patient arrives with excellent motivation to participate in physical therapy. Patient has remaining deficits in postural control, transferring/sit to stand, strength, and decreased R ankle dorsiflexion.He reports improvement in ability to perform stair negotiation and sit to stand. Continued work on toe clearance and balance re-training today without significant LOB. Patient will benefit from continued skilled therapeutic intervention to address the above deficits as needed for improved function and QoL.    Personal Factors and Comorbidities Age;Comorbidity 3+;Past/Current Experience    Comorbidities aortic atherosclerosis, hyperlipidemia, HTN, CHF, PVD, hx of prostate cancer, OA, osteopenia    Examination-Activity Limitations Stairs;Stand;Transfers;Locomotion Level;Squat    Examination-Participation Restrictions Church;Cleaning;Community Activity   pt participates in choir and has to hold onto nearby item for balance   Stability/Clinical Decision Making Evolving/Moderate complexity    Rehab Potential Good    PT Frequency 2x / week    PT Duration 8 weeks    PT Treatment/Interventions ADLs/Self Care  Home Management;Electrical Stimulation;Cryotherapy;Moist Heat;Gait training;Stair training;Functional mobility training;Therapeutic activities;Therapeutic exercise;Balance training;Neuromuscular re-education;Patient/family education    PT Next Visit Plan Postural control training, foot clearance training, weight shifting activities, LE strengthening. Update HEP with successive PT visits.    PT Home Exercise Plan Access Code [email or web address]    Consulted and Agree with Plan of Care Patient             Patient will benefit from skilled  therapeutic intervention in order to improve the following deficits and impairments:  Abnormal gait, Decreased balance, Difficulty walking, Decreased strength  Visit Diagnosis: Physical deconditioning  Imbalance  Difficulty in walking, not elsewhere classified  Muscle weakness (generalized)     Problem List There are no problems to display for this patient.  Valentina Gu, PT, DPT #[id number]  Eilleen Kempf, PT 01/18/2022, 9:10 PM  [facility] [address] [place], [place], [address] Phone: [phone]   Fax:  [phone]  Name: Phelix Fudala MRN: [id number] Date of Birth: 05/15/1951

## 2022-01-20 ENCOUNTER — Ambulatory Visit: Payer: Medicare Other | Attending: Gerontology | Admitting: Physical Therapy

## 2022-01-20 ENCOUNTER — Encounter: Payer: Self-pay | Admitting: Physical Therapy

## 2022-01-20 ENCOUNTER — Other Ambulatory Visit: Payer: Self-pay

## 2022-01-20 DIAGNOSIS — R5381 Other malaise: Secondary | ICD-10-CM | POA: Insufficient documentation

## 2022-01-20 DIAGNOSIS — R262 Difficulty in walking, not elsewhere classified: Secondary | ICD-10-CM | POA: Diagnosis present

## 2022-01-20 DIAGNOSIS — R2689 Other abnormalities of gait and mobility: Secondary | ICD-10-CM | POA: Diagnosis present

## 2022-01-20 DIAGNOSIS — M6281 Muscle weakness (generalized): Secondary | ICD-10-CM | POA: Insufficient documentation

## 2022-01-20 NOTE — Therapy (Signed)
[facility] [address] [place], [place], [address] Phone: [phone]   Fax:  [phone]  Physical Therapy Treatment  Patient Details  Name: Chad Norman MRN: [id number] Date of Birth: December 17, 1950 Referring Provider (PT): [name]   Encounter Date: 01/20/2022   PT End of Session - 01/25/22 0737     Visit Number 8    Number of Visits 17    Date for PT Re-Evaluation 02/24/22    Authorization Type [employer] Medicare    Authorization Time Period VL based on medical necessity    Progress Note Due on Visit 10    PT Start Time 1517    PT Stop Time 1559    PT Time Calculation (min) 42 min    Equipment Utilized During Treatment Gait belt    Activity Tolerance Patient tolerated treatment well    Behavior During Therapy WFL for tasks assessed/performed             Past Medical History:  Diagnosis Date   Aortic atherosclerosis ([place])    B12 deficiency    HLD (hyperlipidemia)    Hypertension    Hypertensive heart disease with chronic diastolic congestive heart failure ([place])    Lymphocytic colitis 2021   OSA on CPAP    Osteoarthritis    Osteopenia    Prediabetes    Prostate cancer ([place])    PVD (peripheral vascular disease) ([place])    Renal cyst, left     Past Surgical History:  Procedure Laterality Date   COLONOSCOPY N/A 09/07/2020   Procedure: COLONOSCOPY;  Surgeon: [name], MD;  Location: [facility] ENDOSCOPY;  Service: Endoscopy;  Laterality: N/A;   CYSTOSCOPY MACROPLASTIQUE IMPLANT N/A 07/15/2021   Procedure: CYSTOSCOPY MACROPLASTIQUE IMPLANT;  Surgeon: [name], MD;  Location: [facility] ORS;  Service: Urology;  Laterality: N/A;   KNEE ARTHROSCOPY Right 1999   PROSTATE SURGERY N/A 6063   RALP   UMBILICAL HERNIA REPAIR N/A 04/2019    There were no vitals filed for this visit.   Subjective Assessment - 01/25/22 0737     Subjective Patient reports tolerating last session well. He reports no recent  exacerbation of R knee pain. Patient reports no recent near-falls or  major incidents outside of the clinic. Patient reports no significant soreness following last session.    Pertinent History Patient is a 71 year old male referred for physical deconditioning. Patient reports that he was doing PT in [place] in 2022. Patient reports having trouble with balance. He reports difficulty with transferring and gait stability. 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 Patient reports difficulty with getting up (his wife states that all seats are low relative to patient's height). Patient's wife feels that his strength got worse following COVID-19 infection. Pt denies difficulty with SOB/aerobic endurance. Patient reports he had lumbar spine injury in 2015 - patient reports having difficulty with walking since then. Patient reports no vertigo/dizziness. Pt reports no major visual deficits - wife reports he does have cataracts. One fall in last 6 months (Sept 2022); fell going down back steps (patio in back of home - low lying handrail). Patient reports Hx of prostate cancer and history of prostatectomy.  Patient has 3 steps to get into home from front porch with single banister. Garage entrance has 3 steps to get in also with banister on either side. Home is one level. Pt has walk-in shower; pt has grab bars, textured bathroom floor. Patient has  to traverse concrete only to get into home. Pt feels that he has to have handrail to traverse stairs. Pt reports that since having lumbar radiculopathy, he drag his R toes sometimes. He reports bilateral numbness in his feet without specific digits/toes involved. Patient reports hx of R knee OA and MD has recommended TKA.   Recent changes in overall health/medication: Yes, COVID-19 infection in 2022; pt reports worsening weakness since then.    Limitations Walking;House hold activities    Patient Stated Goals able to walk better, able to get out of chair more  readily                 There.ex:              Nu-Step L5 for 5 minutes for UE/LE use for LE strength and cardiopulmonary endurance. - interval subjective gathered during this time, 2 minutes unbilled              Standing hip abduction with 4# AW's, 2x10/LE with UE support              Standing hip extension with 4# AW's, 2x10/LE with UE support. VC's for maintaining neutral lumbar positioning              Standing hip flexion with 4# AW's: 2x10/LE with UE support. VC's for eccentric control due and upright posture          HEP update and review: to include 3-way hip and forward toe tapping   *not today*     Seated LAQ: R/L knee 5# AW's with 3 sec holds at contraction. 2x10/LE.        Neuro Re-Ed:   Toe-tap with 3 cones at // bars on floor: x 1 minutes with alternating LE with tapping color called by PT in various orders and 1-2 finger support. 3 cones. CGA/supervision. Frequent VC's for reducing support to single finger.                 Airex beam:                          Lateral marches requiring BUE support for increased hip flexion and increased time in SLS: x8. CGA.   Standing semitandem on Airex pad (2 Airex pads adjacent to accommodate foot size); 2x20sec, bilateral               Forwards and lateral 6 hurdle navigation: x6 laps in // bars, CGA provided.. X4 with step through pattern and SUE support. Side side steps x 4 D/B with 2 finger support    Tandem walking: x8 laps in // bars. X2 performed SUE support. X6 single finger support. Supervision level of assist.       ASSESSMENT Patient has tolerated progression in intensity of exercises and increasing demands on postural control well. Patient is improving in ability to clear toes over obstacles and complete sit to stand transfer without heavy reliance on upper limbs to initiate transfer. Patient demonstrates improving static and dynamic postural control as exhibited by tandem walking with light UE support and  static semitandem stance performance. Patient will benefit from continued skilled therapeutic intervention to address the above deficits as needed for improved function, fall risk, and QoL.       PT Short Term Goals - 12/28/21 1532       PT SHORT TERM GOAL #1   Title Pt will be independent with HEP in order to improve  strength and balance in order to decrease fall risk and improve function at home and work.    Baseline 12/28/21: Baseline HEP initiated    Time 3    Period Weeks    Status New    Target Date 01/18/22      PT SHORT TERM GOAL #2   Title Patient will perform independent sit to stand from standard-height chair without upper extremity support required to initiate transfer as needed for functional mobility to access home and community    Baseline 12/28/21: UE assist required for sit to stand with intermittent incompletion of transfer    Time 4    Period Weeks    Status New    Target Date 01/25/22               PT Long Term Goals - 12/28/21 1536       PT LONG TERM GOAL #1   Title Pt will improve BERG to 45 org greater in order to demonstrate clinically significant improvement in balance and to meet cut-off score indicative of no increased risk of falls    Baseline 12/28/21: BERG 40/56.    Time 8    Period Weeks    Status New    Target Date 02/24/22      PT LONG TERM GOAL #2   Title Pt will decrease TUG to below 14 seconds/decrease in order to demonstrate decreased fall risk.    Baseline 12/28/21: 17.5 sec.    Time 8    Period Weeks    Status New    Target Date 02/24/22      PT LONG TERM GOAL #3   Title Pt will improve ABC by at least 13% in order to demonstrate clinically significant improvement in balance confidence.    Baseline ABC Scale: 56.25%    Time 8    Period Weeks    Status New    Target Date 02/24/22      PT LONG TERM GOAL #4   Title Pt will decrease 5TSTS by at least 3 seconds in order to demonstrate clinically significant improvement in LE strength.     Baseline 12/28/21: 56 sec    Time 8    Period Weeks    Status New    Target Date 02/24/22      PT LONG TERM GOAL #5   Title Patient will be able to complete full church service with choir participation/singing without having to use nearby seat/support for postural control and no LOB    Baseline 12/28/21:Difficulty with maintaining balance, having to grab nearby seat for maintaining standing    Time 8    Period Weeks    Target Date 02/24/22                   Plan - 01/25/22 0742     Clinical Impression Statement Patient has tolerated progression in intensity of exercises and increasing demands on postural control well. Patient is improving in ability to clear toes over obstacles and complete sit to stand transfer without heavy reliance on upper limbs to initiate transfer. Patient demonstrates improving static and dynamic postural control as exhibited by tandem walking with light UE support and static semitandem stance performance. Patient will benefit from continued skilled therapeutic intervention to address the above deficits as needed for improved function, fall risk, and QoL.    Personal Factors and Comorbidities Age;Comorbidity 3+;Past/Current Experience    Comorbidities aortic atherosclerosis, hyperlipidemia, HTN, CHF, PVD, hx of prostate cancer, OA, osteopenia  Examination-Activity Limitations Stairs;Stand;Transfers;Locomotion Level;Squat    Examination-Participation Restrictions Church;Cleaning;Community Activity   pt participates in choir and has to hold onto nearby item for balance   Stability/Clinical Decision Making Evolving/Moderate complexity    Rehab Potential Good    PT Frequency 2x / week    PT Duration 8 weeks    PT Treatment/Interventions ADLs/Self Care Home Management;Electrical Stimulation;Cryotherapy;Moist Heat;Gait training;Stair training;Functional mobility training;Therapeutic activities;Therapeutic exercise;Balance training;Neuromuscular  re-education;Patient/family education    PT Next Visit Plan Postural control training, foot clearance training, weight shifting activities, LE strengthening. Update HEP with successive PT visits.    PT Home Exercise Plan Access Code [email or web address]    Consulted and Agree with Plan of Care Patient             Patient will benefit from skilled therapeutic intervention in order to improve the following deficits and impairments:  Abnormal gait, Decreased balance, Difficulty walking, Decreased strength  Visit Diagnosis: Physical deconditioning  Imbalance  Difficulty in walking, not elsewhere classified  Muscle weakness (generalized)     Problem List There are no problems to display for this patient.  Valentina Gu, PT, DPT #[id number]  Eilleen Kempf, PT 01/25/2022, 7:42 AM  [facility] [address] [place], [place], [address] Phone: [phone]   Fax:  [phone]  Name: Chad Norman MRN: [id number] Date of Birth: March 07, 1951

## 2022-01-25 ENCOUNTER — Ambulatory Visit: Payer: Medicare Other | Admitting: Physical Therapy

## 2022-01-25 ENCOUNTER — Other Ambulatory Visit: Payer: Self-pay

## 2022-01-25 DIAGNOSIS — R5381 Other malaise: Secondary | ICD-10-CM

## 2022-01-25 DIAGNOSIS — M6281 Muscle weakness (generalized): Secondary | ICD-10-CM

## 2022-01-25 DIAGNOSIS — R262 Difficulty in walking, not elsewhere classified: Secondary | ICD-10-CM

## 2022-01-25 DIAGNOSIS — R2689 Other abnormalities of gait and mobility: Secondary | ICD-10-CM

## 2022-01-25 NOTE — Therapy (Signed)
[facility] [address] [place], [place], [address] Phone: [phone]   Fax:  [phone]  Physical Therapy Treatment  Patient Details  Name: Chad Norman MRN: [id number] Date of Birth: June 26, 1951 Referring Provider (PT): [name]   Encounter Date: 01/25/2022   PT End of Session - 01/27/22 0936     Visit Number 9    Number of Visits 17    Date for PT Re-Evaluation 02/24/22    Authorization Type [employer]HC Medicare    Authorization Time Period VL based on medical necessity    Progress Note Due on Visit 10    PT Start Time 1517    PT Stop Time 1559    PT Time Calculation (min) 42 min    Equipment Utilized During Treatment Gait belt    Activity Tolerance Patient tolerated treatment well    Behavior During Therapy WFL for tasks assessed/performed             Past Medical History:  Diagnosis Date   Aortic atherosclerosis ([place])    B12 deficiency    HLD (hyperlipidemia)    Hypertension    Hypertensive heart disease with chronic diastolic congestive heart failure ([place])    Lymphocytic colitis 2021   OSA on CPAP    Osteoarthritis    Osteopenia    Prediabetes    Prostate cancer ([place])    PVD (peripheral vascular disease) ([place])    Renal cyst, left     Past Surgical History:  Procedure Laterality Date   COLONOSCOPY N/A 09/07/2020   Procedure: COLONOSCOPY;  Surgeon: [name], MD;  Location: [facility] ENDOSCOPY;  Service: Endoscopy;  Laterality: N/A;   CYSTOSCOPY MACROPLASTIQUE IMPLANT N/A 07/15/2021   Procedure: CYSTOSCOPY MACROPLASTIQUE IMPLANT;  Surgeon: [name], MD;  Location: [facility] ORS;  Service: Urology;  Laterality: N/A;   KNEE ARTHROSCOPY Right 1999   PROSTATE SURGERY N/A 3154   RALP   UMBILICAL HERNIA REPAIR N/A 04/2019    There were no vitals filed for this visit.   Subjective Assessment - 01/27/22 0936     Subjective Patient voices no new complaints at arrival to PT. He states he has not  performed updated HEP yet. He reports tolerating last session well and denies significant musculoskeletal/lower limb pain at arrival today.    Pertinent History Patient is a 71 year old male referred for physical deconditioning. Patient reports that he was doing PT in [place] in 2022. Patient reports having trouble with balance. He reports difficulty with transferring and gait stability. 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 Patient reports difficulty with getting up (his wife states that all seats are low relative to patient's height). Patient's wife feels that his strength got worse following COVID-19 infection. Pt denies difficulty with SOB/aerobic endurance. Patient reports he had lumbar spine injury in 2015 - patient reports having difficulty with walking since then. Patient reports no vertigo/dizziness. Pt reports no major visual deficits - wife reports he does have cataracts. One fall in last 6 months (Sept 2022); fell going down back steps (patio in back of home - low lying handrail). Patient reports Hx of prostate cancer and history of prostatectomy.  Patient has 3 steps to get into home from front porch with single banister. Garage entrance has 3 steps to get in also with banister on either side. Home is one level. Pt has walk-in shower; pt has grab bars, textured bathroom floor. Patient has to traverse concrete  only to get into home. Pt feels that he has to have handrail to traverse stairs. Pt reports that since having lumbar radiculopathy, he drag his R toes sometimes. He reports bilateral numbness in his feet without specific digits/toes involved. Patient reports hx of R knee OA and MD has recommended TKA.   Recent changes in overall health/medication: Yes, COVID-19 infection in 2022; pt reports worsening weakness since then.    Limitations Walking;House hold activities    Patient Stated Goals able to walk better, able to get out of chair more readily                 There.ex:              Nu-Step L5 for 5 minutes for UE/LE use for LE strength and cardiopulmonary endurance. - interval subjective obtained during this time, 2 minutes unbilled          Forward step-up with hurdle stance at top; 6-inch step; 1x10 on L, 1x6 on R with intermittent buckling and LOB, ModA to prevent fall with transient increase in R knee pain       *not today* Standing hip abduction with 4# AW's, 2x10/LE with UE support  Standing hip extension with 4# AW's, 2x10/LE with UE support. VC's for maintaining neutral lumbar positioning  Standing hip flexion with 4# AW's: 2x10/LE with UE support. VC's for eccentric control due and upright posture      Seated LAQ: R/L knee 5# AW's with 3 sec holds at contraction. 2x10/LE.        Neuro Re-Ed:   Toe-tap with 3 inverted cones at // bars on floor: x 1 minutes with alternating LE with tapping color called by PT in various orders and 1-2 finger support. CGA/supervision. Frequent VC's for reducing support to single finger.                Standing semitandem on Airex pad (2 Airex pads adjacent to accommodate foot size); 2x25sec, bilateral; intermittent 1-2 finger touch on bar prn               Forwards and lateral 6 hurdle navigation: x6 laps in // bars, CGA provided. X4 with step through pattern and SUE support. Side side steps x 4 D/B with 2 finger support    Tandem walking: x8 laps in // bars. X2 performed SUE support. X6 single finger support. Supervision level of assist.    Consecutive step up/down on Airex pads; x3 Airex pads in // bars; x4 D/B   *not today*   Airex beam: Lateral marches requiring BUE support for increased hip flexion and increased time in SLS: x8. CGA.     ASSESSMENT Patient demonstrates improving ability to perform transferring and stair negotiation with step-to pattern. Today's session included reciprocal step-up exercise with good initial performance with UE support on handrails as needed - pt has episode of  R knee buckling with LOB. Pt is able to recover his balance without assist with abrupt lowering of COM and heavy UE support on rails with PT guarding carefully adjacent to patient. Pt has intermittent pain in R knee (longstanding pain and mobility deficits following old arthroscopic surgery per pt report) with no notable ecchymosis, increase in edema, or change in ROM. Pt exhibits mild antalgic pattern immediately following RLE buckling, but he is abl to resume PT today with minimal complaints and performs well with hurdle stepping at end of session. Pt has made good progress to date, but he still has remaining  deficits with gait, balance, sit to stand and stair negotiation. Patient will benefit from continued skilled therapeutic intervention to address the above deficits as needed for improved function, fall risk, and QoL.          PT Short Term Goals - 12/28/21 1532       PT SHORT TERM GOAL #1   Title Pt will be independent with HEP in order to improve strength and balance in order to decrease fall risk and improve function at home and work.    Baseline 12/28/21: Baseline HEP initiated    Time 3    Period Weeks    Status New    Target Date 01/18/22      PT SHORT TERM GOAL #2   Title Patient will perform independent sit to stand from standard-height chair without upper extremity support required to initiate transfer as needed for functional mobility to access home and community    Baseline 12/28/21: UE assist required for sit to stand with intermittent incompletion of transfer    Time 4    Period Weeks    Status New    Target Date 01/25/22               PT Long Term Goals - 12/28/21 1536       PT LONG TERM GOAL #1   Title Pt will improve BERG to 45 org greater in order to demonstrate clinically significant improvement in balance and to meet cut-off score indicative of no increased risk of falls    Baseline 12/28/21: BERG 40/56.    Time 8    Period Weeks    Status New    Target  Date 02/24/22      PT LONG TERM GOAL #2   Title Pt will decrease TUG to below 14 seconds/decrease in order to demonstrate decreased fall risk.    Baseline 12/28/21: 17.5 sec.    Time 8    Period Weeks    Status New    Target Date 02/24/22      PT LONG TERM GOAL #3   Title Pt will improve ABC by at least 13% in order to demonstrate clinically significant improvement in balance confidence.    Baseline ABC Scale: 56.25%    Time 8    Period Weeks    Status New    Target Date 02/24/22      PT LONG TERM GOAL #4   Title Pt will decrease 5TSTS by at least 3 seconds in order to demonstrate clinically significant improvement in LE strength.    Baseline 12/28/21: 56 sec    Time 8    Period Weeks    Status New    Target Date 02/24/22      PT LONG TERM GOAL #5   Title Patient will be able to complete full church service with choir participation/singing without having to use nearby seat/support for postural control and no LOB    Baseline 12/28/21:Difficulty with maintaining balance, having to grab nearby seat for maintaining standing    Time 8    Period Weeks    Target Date 02/24/22                   Plan - 01/27/22 0941     Clinical Impression Statement Patient demonstrates improving ability to perform transferring and stair negotiation with step-to pattern. Today's session included reciprocal step-up exercise with good initial performance with UE support on handrails as needed - pt has episode of R knee buckling  with LOB. Pt is able to recover his balance without assist with abrupt lowering of COM and heavy UE support on rails with PT guarding carefully adjacent to patient. Pt has intermittent pain in R knee (longstanding pain and mobility deficits following old arthroscopic surgery per pt report) with no notable ecchymosis, increase in edema, or change in ROM. Pt exhibits mild antalgic pattern immediately following RLE buckling, but he is abl to resume PT today with minimal complaints  and performs well with hurdle stepping at end of session. Pt has made good progress to date, but he still has remaining deficits with gait, balance, sit to stand and stair negotiation. Patient will benefit from continued skilled therapeutic intervention to address the above deficits as needed for improved function, fall risk, and QoL.    Personal Factors and Comorbidities Age;Comorbidity 3+;Past/Current Experience    Comorbidities aortic atherosclerosis, hyperlipidemia, HTN, CHF, PVD, hx of prostate cancer, OA, osteopenia    Examination-Activity Limitations Stairs;Stand;Transfers;Locomotion Level;Squat    Examination-Participation Restrictions Church;Cleaning;Community Activity   pt participates in choir and has to hold onto nearby item for balance   Stability/Clinical Decision Making Evolving/Moderate complexity    Rehab Potential Good    PT Frequency 2x / week    PT Duration 8 weeks    PT Treatment/Interventions ADLs/Self Care Home Management;Electrical Stimulation;Cryotherapy;Moist Heat;Gait training;Stair training;Functional mobility training;Therapeutic activities;Therapeutic exercise;Balance training;Neuromuscular re-education;Patient/family education    PT Next Visit Plan Postural control training, foot clearance training, weight shifting activities, LE strengthening. Update HEP with successive PT visits.    PT Home Exercise Plan Access Code [email or web address]    Consulted and Agree with Plan of Care Patient             Patient will benefit from skilled therapeutic intervention in order to improve the following deficits and impairments:  Abnormal gait, Decreased balance, Difficulty walking, Decreased strength  Visit Diagnosis: Physical deconditioning  Imbalance  Difficulty in walking, not elsewhere classified  Muscle weakness (generalized)     Problem List There are no problems to display for this patient.  Valentina Gu, PT, DPT #[id number]  Eilleen Kempf, PT 01/27/2022, 9:45  AM  [facility] [address] [place], [place], [address] Phone: [phone]   Fax:  [phone]  Name: Chad Norman MRN: [id number] Date of Birth: 1951/03/16

## 2022-01-27 ENCOUNTER — Ambulatory Visit: Payer: Medicare Other | Admitting: Physical Therapy

## 2022-01-27 ENCOUNTER — Encounter: Payer: Self-pay | Admitting: Physical Therapy

## 2022-01-27 ENCOUNTER — Other Ambulatory Visit: Payer: Self-pay

## 2022-01-27 DIAGNOSIS — R2689 Other abnormalities of gait and mobility: Secondary | ICD-10-CM

## 2022-01-27 DIAGNOSIS — R5381 Other malaise: Secondary | ICD-10-CM | POA: Diagnosis not present

## 2022-01-27 DIAGNOSIS — R262 Difficulty in walking, not elsewhere classified: Secondary | ICD-10-CM

## 2022-01-27 DIAGNOSIS — M6281 Muscle weakness (generalized): Secondary | ICD-10-CM

## 2022-01-27 NOTE — Therapy (Signed)
[facility] [address] [place], [place], [address] Phone: [phone]   Fax:  [phone]  Physical Therapy Treatment/Physical Therapy Progress Note   Dates of reporting period  12/28/21   to   01/27/22  Patient Details  Name: Djimon Lundstrom MRN: [id number] Date of Birth: Feb 17, 1951 Referring Provider (PT): [name]   Encounter Date: 01/27/2022   PT End of Session - 01/29/22 2125     Visit Number 10    Number of Visits 17    Date for PT Re-Evaluation 02/24/22    Authorization Type U[employer] Medicare    Authorization Time Period VL based on medical necessity; prog note 01/27/22    Progress Note Due on Visit 10    PT Start Time 1514    PT Stop Time 1559    PT Time Calculation (min) 45 min    Equipment Utilized During Treatment Gait belt    Activity Tolerance Patient tolerated treatment well    Behavior During Therapy WFL for tasks assessed/performed             Past Medical History:  Diagnosis Date   Aortic atherosclerosis ([place])    B12 deficiency    HLD (hyperlipidemia)    Hypertension    Hypertensive heart disease with chronic diastolic congestive heart failure ([place])    Lymphocytic colitis 2021   OSA on CPAP    Osteoarthritis    Osteopenia    Prediabetes    Prostate cancer ([place])    PVD (peripheral vascular disease) ([place])    Renal cyst, left     Past Surgical History:  Procedure Laterality Date   COLONOSCOPY N/A 09/07/2020   Procedure: COLONOSCOPY;  Surgeon: [name], MD;  Location: [facility] ENDOSCOPY;  Service: Endoscopy;  Laterality: N/A;   CYSTOSCOPY MACROPLASTIQUE IMPLANT N/A 07/15/2021   Procedure: CYSTOSCOPY MACROPLASTIQUE IMPLANT;  Surgeon: [name], MD;  Location: [facility] ORS;  Service: Urology;  Laterality: N/A;   KNEE ARTHROSCOPY Right 1999   PROSTATE SURGERY N/A 0623   RALP   UMBILICAL HERNIA REPAIR N/A 04/2019    There were no vitals filed for this visit.   Subjective Assessment -  01/29/22 2126     Subjective Patient reports not having as much difficult with "getting up and down." Patient reports 50% SANE score. Patient reports he wants to be able to walk in woods and outdoor terrain. Patient reports modest progress with getting up stairs.    Pertinent History Patient is a 71 year old male referred for physical deconditioning. Patient reports that he was doing PT in [place] in 2022. Patient reports having trouble with balance. He reports difficulty with transferring and gait stability. 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 Patient reports difficulty with getting up (his wife states that all seats are low relative to patient's height). Patient's wife feels that his strength got worse following COVID-19 infection. Pt denies difficulty with SOB/aerobic endurance. Patient reports he had lumbar spine injury in 2015 - patient reports having difficulty with walking since then. Patient reports no vertigo/dizziness. Pt reports no major visual deficits - wife reports he does have cataracts. One fall in last 6 months (Sept 2022); fell going down back steps (patio in back of home - low lying handrail). Patient reports Hx of prostate cancer and history of prostatectomy.  Patient has 3 steps to get into home from front porch with single banister. Garage entrance has 3 steps to get in  also with banister on either side. Home is one level. Pt has walk-in shower; pt has grab bars, textured bathroom floor. Patient has to traverse concrete only to get into home. Pt feels that he has to have handrail to traverse stairs. Pt reports that since having lumbar radiculopathy, he drag his R toes sometimes. He reports bilateral numbness in his feet without specific digits/toes involved. Patient reports hx of R knee OA and MD has recommended TKA.   Recent changes in overall health/medication: Yes, COVID-19 infection in 2022; pt reports worsening weakness since then.    Limitations  Walking;House hold activities    Patient Stated Goals able to walk better, able to get out of chair more readily                  FUNCTIONAL OUTCOME MEASURES       Results Comments  BERG 12/28/21: 40/56 01/27/22: 44/56 Fall risk, in need of intervention  TUG 12/28/21: 17.5 seconds 01/27/22: 13 seconds    5TSTS 12/28/21: 56 seconds  01/27/22: 75 seconds     ABC Scale 12/28/21: 56.25% 01/27/22: 79.4%      FOTO Score:  49    FUNCTIONAL TASKS   Sit to stand: Performed without UE assist after multiple attempts with rapid-velocity trunk flexion to create forward momentum for transfer and difficulty continuing transfer beyond initial lift-off from seat    Therapeutic Activities  Re-assessment performed (see above and updated Goal section below)     There.ex             Nu-Step L5 for 5 minutes for UE/LE use for LE strength and cardiopulmonary endurance. - interval subjective obtained during this time, 2 minutes unbilled          *next visit* Forward step-up with hurdle stance at top; 6-inch step; 1x10 on L, 1x6 on R with intermittent buckling and LOB, ModA to prevent fall with transient increase in R knee pain                   *not today* Standing hip abduction with 4# AW's, 2x10/LE with UE support  Standing hip extension with 4# AW's, 2x10/LE with UE support. VC's for maintaining neutral lumbar positioning  Standing hip flexion with 4# AW's: 2x10/LE with UE support. VC's for eccentric control due and upright posture                 Seated LAQ: R/L knee 5# AW's with 3 sec holds at contraction. 2x10/LE.        Neuro Re-Ed:    In // bars: Dynamic march; x3 D/B               Forwards and lateral 6 hurdle navigation: x6 laps in // bars, CGA provided. X4 with step through pattern and SUE support. Side side steps x 4 D/B with 2 finger support                 Consecutive step up/down on Airex pads; x3 Airex pads in // bars; x4 D/B  *next visit* Tandem walking: x8 laps in //  bars. X2 performed SUE support. X6 single finger support. Supervision level of assist.   *not today* Toe-tap with 3 inverted cones at // bars on floor: x 1 minutes with alternating LE with tapping color called by PT in various orders and 1-2 finger support. CGA/supervision. Frequent VC's for reducing support to single finger.  Standing semitandem on Airex pad (2 Airex pads adjacent to accommodate foot size); 2x25sec, bilateral; intermittent 1-2 finger touch on bar prn   Airex beam: Lateral marches requiring BUE support for increased hip flexion and increased time in SLS: x8. CGA.     ASSESSMENT Patient has significantly progressed with BERG, TUG, and ABC Scale relative to his initial eval. He feels that his ability to perform transferring and stair/obstacle negotiation has improved, but pt is still limited with strength and balance currently. Pt has improved FOTO score, but has not yet met predicted FOTO score. He did have significantly longer duration with 5TSTS today, but this can be reflective of R knee pain/stiffness limiting his ability to perform transferring. Pt has previously discussed with his orthopedist that he may need TKA. Pt has made good progress to date, but he still has remaining deficits with gait, balance, sit to stand and stair negotiation. Patient will benefit from continued skilled therapeutic intervention to address the above deficits as needed for improved function, fall risk, and QoL.       PT Short Term Goals - 01/29/22 2126       PT SHORT TERM GOAL #1   Title Pt will be independent with HEP in order to improve strength and balance in order to decrease fall risk and improve function at home and work.    Baseline 12/28/21: Baseline HEP initiated  01/27/22: Pt compliant with initial HEP, intermittent non-compliance with updated exercises.    Time 3    Period Weeks    Status On-going    Target Date 02/10/22      PT SHORT TERM GOAL #2   Title Patient will perform  independent sit to stand from [place] chair without upper extremity support required to initiate transfer as needed for functional mobility to access home and community    Baseline 12/28/21: UE assist required for sit to stand with intermittent incompletion of transfer.  01/27/22: Pt able to perform with notable momentum to facilitate initial lift from seat and multiple attempts    Time 4    Period Weeks    Status Partially Met    Target Date 02/17/22               PT Long Term Goals - 01/29/22 2128       PT LONG TERM GOAL #1   Title Pt will improve BERG to 45 or greater in order to demonstrate clinically significant improvement in balance and to meet cut-off score indicative of no increased risk of falls    Baseline 12/28/21: BERG 40/56.    01/27/22: BERG 44/56.    Time 8    Period Weeks    Status Partially Met   improved score, clinically meaningful change obtained   Target Date 02/24/22      PT LONG TERM GOAL #2   Title Pt will decrease TUG to below 14 seconds/decrease in order to demonstrate decreased fall risk.    Baseline 12/28/21: 17.5 sec.   01/27/22: 13 sec.    Time 8    Period Weeks    Status Achieved    Target Date 02/24/22      PT LONG TERM GOAL #3   Title Pt will improve ABC by at least 13% in order to demonstrate clinically significant improvement in balance confidence.    Baseline 12/28/21: 56.25%   01/27/22: 79.4%    Time 8    Period Weeks    Status Achieved    Target Date 02/24/22  PT LONG TERM GOAL #4   Title Pt will decrease 5TSTS by at least 3 seconds in order to demonstrate clinically significant improvement in LE strength.    Baseline 12/28/21: 56 sec.  01/27/22: 75 sec    Time 8    Period Weeks    Status Not Met    Target Date 02/24/22      PT LONG TERM GOAL #5   Title Patient will be able to complete full church service with choir participation/singing without having to use nearby seat/support for postural control and no LOB    Baseline  12/28/21:Difficulty with maintaining balance, having to grab nearby seat for maintaining standing.   01/27/22: Difficulty with maintaning balance going up steps and c static balance.    Time 8    Period Weeks    Status On-going    Target Date 02/24/22      Additional Long Term Goals   Additional Long Term Goals Yes      PT LONG TERM GOAL #6   Title Patient will demonstrate improved function as evidenced by a score of 52 on FOTO measure for full participation in activities at home and in the community.    Baseline 12/28/21: 42.   01/27/22: 49.    Time 8    Period Weeks    Status On-going    Target Date 02/24/22                   Plan - 01/29/22 2139     Clinical Impression Statement Patient has significantly progressed with BERG, TUG, and ABC Scale relative to his initial eval. He feels that his ability to perform transferring and stair/obstacle negotiation has improved, but pt is still limited with strength and balance currently. Pt has improved FOTO score, but has not yet met predicted FOTO score. He did have significantly longer duration with 5TSTS today, but this can be reflective of R knee pain/stiffness limiting his ability to perform transferring. Pt has previously discussed with his orthopedist that he may need TKA. Pt has made good progress to date, but he still has remaining deficits with gait, balance, sit to stand and stair negotiation. Patient will benefit from continued skilled therapeutic intervention to address the above deficits as needed for improved function, fall risk, and QoL.    Personal Factors and Comorbidities Age;Comorbidity 3+;Past/Current Experience    Comorbidities aortic atherosclerosis, hyperlipidemia, HTN, CHF, PVD, hx of prostate cancer, OA, osteopenia    Examination-Activity Limitations Stairs;Stand;Transfers;Locomotion Level;Squat    Examination-Participation Restrictions Church;Cleaning;Community Activity   pt participates in choir and has to hold onto  nearby item for balance   Stability/Clinical Decision Making Evolving/Moderate complexity    Rehab Potential Good    PT Frequency 2x / week    PT Duration 8 weeks    PT Treatment/Interventions ADLs/Self Care Home Management;Electrical Stimulation;Cryotherapy;Moist Heat;Gait training;Stair training;Functional mobility training;Therapeutic activities;Therapeutic exercise;Balance training;Neuromuscular re-education;Patient/family education    PT Next Visit Plan Postural control training, foot clearance training, weight shifting activities, LE strengthening. Update HEP with successive PT visits. Recommend continued PT 2x/week for 4 weeks    PT Home Exercise Plan Access Code [email or web address]    Consulted and Agree with Plan of Care Patient             Patient will benefit from skilled therapeutic intervention in order to improve the following deficits and impairments:  Abnormal gait, Decreased balance, Difficulty walking, Decreased strength  Visit Diagnosis: Physical deconditioning  Imbalance  Difficulty in walking, not  elsewhere classified  Muscle weakness (generalized)     Problem List There are no problems to display for this patient.  Valentina Gu, PT, DPT #[id number]  Eilleen Kempf, PT 01/29/2022, 9:40 PM  [facility] [address] [place], [place], [address] Phone: [phone]   Fax:  [phone]  Name: Mansur Patti MRN: [id number] Date of Birth: May 04, 1951

## 2022-01-29 ENCOUNTER — Encounter: Payer: Self-pay | Admitting: Physical Therapy

## 2022-01-31 DIAGNOSIS — N3941 Urge incontinence: Secondary | ICD-10-CM | POA: Insufficient documentation

## 2022-02-01 ENCOUNTER — Encounter: Payer: Self-pay | Admitting: Physical Therapy

## 2022-02-01 ENCOUNTER — Other Ambulatory Visit: Payer: Self-pay

## 2022-02-01 ENCOUNTER — Ambulatory Visit: Payer: Medicare Other | Admitting: Physical Therapy

## 2022-02-01 DIAGNOSIS — R5381 Other malaise: Secondary | ICD-10-CM

## 2022-02-01 DIAGNOSIS — R2689 Other abnormalities of gait and mobility: Secondary | ICD-10-CM

## 2022-02-01 DIAGNOSIS — M6281 Muscle weakness (generalized): Secondary | ICD-10-CM

## 2022-02-01 DIAGNOSIS — R262 Difficulty in walking, not elsewhere classified: Secondary | ICD-10-CM

## 2022-02-01 NOTE — Therapy (Signed)
[place] [facility] ?[address] [name], [place], [address] ?Phone: [phone]   Fax:  [phone] ? ?Physical Therapy Treatment ? ?Patient Details  ?Name: Chad Norman ?MRN: [id number] ?Date of Birth: [birth year] ?Referring Provider (PT): [name] ? ? ?Encounter Date: 02/01/2022 ? ? PT End of Session - 02/01/22 1523   ? ? Visit Number 11   ? Number of Visits 17   ? Date for PT Re-Evaluation 02/24/22   ? Authorization Type UHC Medicare   ? Authorization Time Period VL based on medical necessity; prog note 01/27/22   ? Progress Note Due on Visit 17   ? PT Start Time 2025   ? PT Stop Time 4270   ? PT Time Calculation (min) 42 min   ? Equipment Utilized During Treatment Gait belt   ? Activity Tolerance Patient tolerated treatment well   ? Behavior During Therapy [facility] for tasks assessed/performed   ? ?  ?  ? ?  ? ? ?Past Medical History:  ?Diagnosis Date  ? Aortic atherosclerosis ([place])   ? B12 deficiency   ? HLD (hyperlipidemia)   ? Hypertension   ? Hypertensive heart disease with chronic diastolic congestive heart failure ([place])   ? Lymphocytic colitis 2021  ? OSA on CPAP   ? Osteoarthritis   ? Osteopenia   ? Prediabetes   ? Prostate cancer ([place])   ? PVD (peripheral vascular disease) ([place])   ? Renal cyst, left   ? ? ?Past Surgical History:  ?Procedure Laterality Date  ? COLONOSCOPY N/A 09/07/2020  ? Procedure: COLONOSCOPY;  Surgeon: [name], MD;  Location: [facility] ENDOSCOPY;  Service: Endoscopy;  Laterality: N/A;  ? CYSTOSCOPY MACROPLASTIQUE IMPLANT N/A 07/15/2021  ? Procedure: CYSTOSCOPY MACROPLASTIQUE IMPLANT;  Surgeon: [name], MD;  Location: [facility] ORS;  Service: Urology;  Laterality: N/A;  ? KNEE ARTHROSCOPY Right 1999  ? PROSTATE SURGERY N/A 2018  ? RALP  ? UMBILICAL HERNIA REPAIR N/A 04/2019  ? ? ?There were no vitals filed for this visit. ? ? Subjective Assessment - 02/01/22 1522   ? ? Subjective Patient reports feeling generally well at arrival  to PT. Patient reports compliance with his updated HEP. Patient reports no other major changes at arrival to PT. Patient denies notable soreness after his previous visit.   ? Pertinent History Patient is a 71 year old male referred for physical deconditioning. Patient reports that he was doing PT in [place] in 2022. Patient reports having trouble with balance. He reports difficulty with transferring and gait stability. 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 Patient reports difficulty with getting up (his wife states that all seats are low relative to patient's height). Patient's wife feels that his strength got worse following COVID-19 infection. Pt denies difficulty with SOB/aerobic endurance. Patient reports he had lumbar spine injury in 2015 - patient reports having difficulty with walking since then. Patient reports no vertigo/dizziness. Pt reports no major visual deficits - wife reports he does have cataracts. One fall in last 6 months (Sept 2022); fell going down back steps (patio in back of home - low lying handrail). Patient reports Hx of prostate cancer and history of prostatectomy.  Patient has 3 steps to get into home from front porch with single banister. Garage entrance has 3 steps to get in also with banister on either side. Home is one level. Pt has walk-in shower; pt has grab bars, textured bathroom floor. Patient  has to traverse concrete only to get into home. Pt feels that he has to have handrail to traverse stairs. Pt reports that since having lumbar radiculopathy, he drag his R toes sometimes. He reports bilateral numbness in his feet without specific digits/toes involved. Patient reports hx of R knee OA and MD has recommended TKA.   Recent changes in overall health/medication: Yes, COVID-19 infection in 2022; pt reports worsening weakness since then.   ? Limitations Walking;House hold activities   ? Patient Stated Goals able to walk better, able to get out of chair more  readily   ? ?  ?  ? ?  ? ? ? ? ? ?  ?There.ex ? ?Nu-Step L5 for 5 minutes for UE/LE use for LE strength and cardiopulmonary endurance. - interval subjective obtained during this time, 2 minutes unbilled ?         ? ?  ?*not today* ?Forward step-up with hurdle stance at top; 6-inch step; 1x10 on L, 1x6 on R with intermittent buckling and LOB, ModA to prevent fall with transient increase in R knee pain  ?Standing hip abduction with 4# AW's, 2x10/LE with UE support  ?Standing hip extension with 4# AW's, 2x10/LE with UE support. VC's for maintaining neutral lumbar positioning  ?Standing hip flexion with 4# AW's: 2x10/LE with UE support. VC's for eccentric control due and upright posture ?            Seated LAQ: R/L knee 5# AW's with 3 sec holds at contraction. 2x10/LE.  ?  ?  ?  ?Neuro Re-Ed:  ?  ?In // bars: Dynamic march; x4 D/B ?  ?Forward hurdle (6") and long Airex (oriented horizontally) step-over; 2x D/B with RLE leading, 2x D/B with LLE leading ?  ?            Consecutive step up/down on Airex pads; 3 Airex pads in // bars; x4 D/B - verbal cueing for sequencing and heel clearance with trailing leg ?  ?Tandem walking:  X5 single finger support. Supervision level of assist. ? ?3-way mini-lunge on blue star; forward, anterolateral, lateral; x 5 each direction, bilateral LE ? ?Toe-tap with 3 inverted cones at // bars on floor: x 1 minutes with alternating LE with tapping color called by PT in various orders and 1-2 finger support. CGA/supervision.  ? ? ?*not today* ?            Standing semitandem on Airex pad (2 Airex pads adjacent to accommodate foot size); 2x25sec, bilateral; intermittent 1-2 finger touch on bar prn ?  Airex beam: Lateral marches requiring BUE support for increased hip flexion and increased time in SLS: x8. CGA. ?  ?  ?ASSESSMENT ?Patient demonstrates improving ability to negotiate obstacles with sound toe clearance. He does have notable difficulty with tasks requiring unipedal stance with abrupt  return to double-limb standing during obstacle negotiation activities. Pt demonstrates excellent postural control during multi-directional minilunge simulating stepping strategy for episode of significant LOB. Pt has made good progress to date, but he still has remaining deficits with gait, balance, sit to stand and stair negotiation. Patient will benefit from continued skilled therapeutic intervention to address the above deficits as needed for improved function, fall risk, and QoL.  ?  ?  ? ? PT Short Term Goals - 01/29/22 2126   ? ?  ? PT SHORT TERM GOAL #1  ? Title Pt will be independent with HEP in order to improve strength and balance in order to decrease fall  risk and improve function at home and work.   ? Baseline 12/28/21: Baseline HEP initiated  01/27/22: Pt compliant with initial HEP, intermittent non-compliance with updated exercises.   ? Time 3   ? Period Weeks   ? Status On-going   ? Target Date 02/10/22   ?  ? PT SHORT TERM GOAL #2  ? Title Patient will perform independent sit to stand from standard-height chair without upper extremity support required to initiate transfer as needed for functional mobility to access home and community   ? Baseline 12/28/21: UE assist required for sit to stand with intermittent incompletion of transfer.  01/27/22: Pt able to perform with notable momentum to facilitate initial lift from seat and multiple attempts   ? Time 4   ? Period Weeks   ? Status Partially Met   ? Target Date 02/17/22   ? ?  ?  ? ?  ? ? ? ? PT Long Term Goals - 01/29/22 2128   ? ?  ? PT LONG TERM GOAL #1  ? Title Pt will improve BERG to 45 or greater in order to demonstrate clinically significant improvement in balance and to meet cut-off score indicative of no increased risk of falls   ? Baseline 12/28/21: BERG 40/56.    01/27/22: BERG 44/56.   ? Time 8   ? Period Weeks   ? Status Partially Met   improved score, clinically meaningful change obtained  ? Target Date 02/24/22   ?  ? PT LONG TERM GOAL #2  ? Title  Pt will decrease TUG to below 14 seconds/decrease in order to demonstrate decreased fall risk.   ? Baseline 12/28/21: 17.5 sec.   01/27/22: 13 sec.   ? Time 8   ? Period Weeks   ? Status Achieved   ? [place]

## 2022-02-03 ENCOUNTER — Ambulatory Visit: Payer: Medicare Other | Admitting: Physical Therapy

## 2022-02-03 ENCOUNTER — Other Ambulatory Visit: Payer: Self-pay

## 2022-02-03 DIAGNOSIS — R5381 Other malaise: Secondary | ICD-10-CM | POA: Diagnosis not present

## 2022-02-03 NOTE — Therapy (Signed)
[name] ?[facility] ?[address] [place], [place], [address] ?Phone: [phone]   Fax:  [phone] ? ?Physical Therapy Treatment ? ?Patient Details  ?Name: Chad Norman ?MRN: [id number] ?Date of Birth: 30-Dec-1950 ?Referring Provider (PT): Chad Common. Norman ? ? ?Encounter Date: 02/03/2022 ? ? PT End of Session - 02/07/22 0913   ? ? Visit Number 12   ? Number of Visits 17   ? Date for PT Re-Evaluation 02/24/22   ? Authorization Type U[employer] Medicare   ? Authorization Time Period VL based on medical necessity; prog note 01/27/22   ? Progress Note Due on Visit 17   ? PT Start Time 6440   ? PT Stop Time 3474   ? PT Time Calculation (min) 43 min   ? Equipment Utilized During Treatment Gait belt   ? Activity Tolerance Patient tolerated treatment well   ? Behavior During Therapy [facility] for tasks assessed/performed   ? ?  ?  ? ?  ? ? ?Past Medical History:  ?Diagnosis Date  ? Aortic atherosclerosis ([place])   ? B12 deficiency   ? HLD (hyperlipidemia)   ? Hypertension   ? Hypertensive heart disease with chronic diastolic congestive heart failure ([place])   ? Lymphocytic colitis 2021  ? OSA on CPAP   ? Osteoarthritis   ? Osteopenia   ? Prediabetes   ? Prostate cancer ([place])   ? PVD (peripheral vascular disease) ([place])   ? Renal cyst, left   ? ? ?Past Surgical History:  ?Procedure Laterality Date  ? COLONOSCOPY N/A 09/07/2020  ? Procedure: COLONOSCOPY;  Surgeon: [name], MD;  Location: [facility];  Service: Endoscopy;  Laterality: N/A;  ? CYSTOSCOPY MACROPLASTIQUE IMPLANT N/A 07/15/2021  ? Procedure: CYSTOSCOPY MACROPLASTIQUE IMPLANT;  Surgeon: [name], MD;  Location: [facility];  Service: Urology;  Laterality: N/A;  ? KNEE ARTHROSCOPY Right 1999  ? PROSTATE SURGERY N/A 2018  ? RALP  ? UMBILICAL HERNIA REPAIR N/A 04/2019  ? ? ?There were no vitals filed for this visit. ? ? Subjective Assessment - 02/07/22 0913   ? ? Subjective Patent reports partial compliance with his HEP.  Patient reports doing okay after his last visit. Patient reports doing okay with steps with completing step-to pattern. Pt went to the gym yesterday and rode bike for 30 minutes. Patient reports he tries to go to the gym 3x/week, but he has not been able to do this yet.   ? Pertinent History Patient is a 71 year old male referred for physical deconditioning. Patient reports that he was doing PT in [place] in 2022. Patient reports having trouble with balance. He reports difficulty with transferring and gait stability. 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 Patient reports difficulty with getting up (his wife states that all seats are low relative to patient's height). Patient's wife feels that his strength got worse following COVID-19 infection. Pt denies difficulty with SOB/aerobic endurance. Patient reports he had lumbar spine injury in 2015 - patient reports having difficulty with walking since then. Patient reports no vertigo/dizziness. Pt reports no major visual deficits - wife reports he does have cataracts. One fall in last 6 months (Sept 2022); fell going down back steps (patio in back of home - low lying handrail). Patient reports Hx of prostate cancer and history of prostatectomy.  Patient has 3 steps to get into home from front porch with single banister. Garage entrance has 3 steps to get  in also with banister on either side. Home is one level. Pt has walk-in shower; pt has grab bars, textured bathroom floor. Patient has to traverse concrete only to get into home. Pt feels that he has to have handrail to traverse stairs. Pt reports that since having lumbar radiculopathy, he drag his R toes sometimes. He reports bilateral numbness in his feet without specific digits/toes involved. Patient reports hx of R knee OA and MD has recommended TKA.   Recent changes in overall health/medication: Yes, COVID-19 infection in 2022; pt reports worsening weakness since then.   ? Limitations  Walking;House hold activities   ? Patient Stated Goals able to walk better, able to get out of chair more readily   ? ?  ?  ? ?  ? ? ? ? ?  ?There.ex ?  ?Nu-Step L5 for 5 minutes for UE/LE use for LE strength and cardiopulmonary endurance. - interval subjective obtained during this time, 3 minutes unbilled ?         ?  ?  ?*not today* ?Forward step-up with hurdle stance at top; 6-inch step; 1x10 on L, 1x6 on R with intermittent buckling and LOB, ModA to prevent fall with transient increase in R knee pain  ?Standing hip abduction with 4# AW's, 2x10/LE with UE support  ?Standing hip extension with 4# AW's, 2x10/LE with UE support. VC's for maintaining neutral lumbar positioning  ?Standing hip flexion with 4# AW's: 2x10/LE with UE support. VC's for eccentric control due and upright posture ?            Seated LAQ: R/L knee 5# AW's with 3 sec holds at contraction. 2x10/LE.  ?  ?  ?  ?Neuro Re-Ed:  ?  ?In // bars: Forward stepping with alternating lateral cone tap; 4 cones on R and L; 4x D/B ?  ?Toe-tap with 3 inverted cones at // bars on floor: x 1 minutes with alternating LE with tapping color called by PT in various orders and 1-2 finger support. CGA/supervision.  ?  ?            Consecutive step up/down on Airex pads; 3 Airex pads in // bars; x4 D/B - verbal cueing for sequencing and heel clearance with trailing leg ?  ?Tandem walking:  X5 single finger support. Supervision level of assist. ? ?Semitandem stance on Airex; 1x30 seconds bilateral ?  ?3-way mini-lunge on blue star; forward, anterolateral, lateral; x 5 each direction, bilateral LE ? ?Sit to stand from chair + Airex pad (pad on seat for decreased depth), with 6-lb Goblet hold; 2x10 ?  ?  ?*not today* ?Forward hurdle (6") and long Airex (oriented horizontally) step-over; 2x D/B with RLE leading, 2x D/B with LLE leading ?In // bars: Dynamic march; x4 D/B ?Standing semitandem on Airex pad (2 Airex pads adjacent to accommodate foot size); 2x25sec, bilateral;  intermittent 1-2 finger touch on bar prn ?  Airex beam: Lateral marches requiring BUE support for increased hip flexion and increased time in SLS: x8. CGA. ?  ?  ? ? ?ASSESSMENT ?Patient demonstrates improving ability to clear toes during obstacle negotiation drills and he is improving with sit to stand performance. He does still have difficulty with initiation of sit to stand from lower chair (e.g. 18-20 inch seat height). He demonstrates more difficult with left lower extremity non-equilibrium coordination stemming from LLE weakness and proprioceptive/kinesthetic deficits. Pt has made good progress to date, but he still has remaining deficits with gait, balance, sit to stand  and stair negotiation. Patient will benefit from continued skilled therapeutic intervention to address the above deficits as needed for improved function, fall risk, and QoL.  ?  ?  ? ? ? ? PT Short Term Goals - 01/29/22 2126   ? ?  ? PT SHORT TERM GOAL #1  ? Title Pt will be independent with HEP in order to improve strength and balance in order to decrease fall risk and improve function at home and work.   ? Baseline 12/28/21: Baseline HEP initiated  01/27/22: Pt compliant with initial HEP, intermittent non-compliance with updated exercises.   ? Time 3   ? Period Weeks   ? Status On-going   ? Target Date 02/10/22   ?  ? PT SHORT TERM GOAL #2  ? Title Patient will perform independent sit to stand from standard-height chair without upper extremity support required to initiate transfer as needed for functional mobility to access home and community   ? Baseline 12/28/21: UE assist required for sit to stand with intermittent incompletion of transfer.  01/27/22: Pt able to perform with notable momentum to facilitate initial lift from seat and multiple attempts   ? Time 4   ? Period Weeks   ? Status Partially Met   ? Target Date 02/17/22   ? ?  ?  ? ?  ? ? ? ? PT Long Term Goals - 01/29/22 2128   ? ?  ? PT LONG TERM GOAL #1  ? Title Pt will improve BERG to  45 or greater in order to demonstrate clinically significant improvement in balance and to meet cut-off score indicative of no increased risk of falls   ? Baseline 12/28/21: BERG 40/56.    01/27/22: BERG 44/56.   ?

## 2022-02-04 ENCOUNTER — Other Ambulatory Visit: Payer: Self-pay | Admitting: Gerontology

## 2022-02-04 DIAGNOSIS — R053 Chronic cough: Secondary | ICD-10-CM

## 2022-02-04 DIAGNOSIS — Z87891 Personal history of nicotine dependence: Secondary | ICD-10-CM

## 2022-02-07 ENCOUNTER — Encounter: Payer: Self-pay | Admitting: Physical Therapy

## 2022-02-08 ENCOUNTER — Other Ambulatory Visit: Payer: Self-pay

## 2022-02-08 ENCOUNTER — Encounter: Payer: Self-pay | Admitting: Physical Therapy

## 2022-02-08 ENCOUNTER — Ambulatory Visit: Payer: Medicare Other | Admitting: Physical Therapy

## 2022-02-08 DIAGNOSIS — M6281 Muscle weakness (generalized): Secondary | ICD-10-CM

## 2022-02-08 DIAGNOSIS — R262 Difficulty in walking, not elsewhere classified: Secondary | ICD-10-CM

## 2022-02-08 DIAGNOSIS — R5381 Other malaise: Secondary | ICD-10-CM | POA: Diagnosis not present

## 2022-02-08 DIAGNOSIS — R2689 Other abnormalities of gait and mobility: Secondary | ICD-10-CM

## 2022-02-08 NOTE — Therapy (Signed)
[place] ?[facility] ?[address] [name], [place], [address] ?Phone: [phone]   Fax:  [phone] ? ?Physical Therapy Treatment ? ?Patient Details  ?Name: [place] ?MRN: [id number] ?Date of Birth: 1951-03-31 ?Referring Provider (PT): [name] ? ? ?Encounter Date: 02/08/2022 ? ? PT End of Session - 02/10/22 1052   ? ? Visit Number 13   ? Number of Visits 17   ? Date for PT Re-Evaluation 02/24/22   ? Authorization Type UHC Medicare   ? Authorization Time Period VL based on medical necessity; prog note 01/27/22   ? Progress Note Due on Visit 17   ? PT Start Time 0254   ? PT Stop Time 1600   ? PT Time Calculation (min) 43 min   ? Equipment Utilized During Treatment Gait belt   ? Activity Tolerance Patient tolerated treatment well   ? Behavior During Therapy [facility] for tasks assessed/performed   ? ?  ?  ? ?  ? ? ?Past Medical History:  ?Diagnosis Date  ? Aortic atherosclerosis ([place])   ? B12 deficiency   ? HLD (hyperlipidemia)   ? Hypertension   ? Hypertensive heart disease with chronic diastolic congestive heart failure ([place])   ? Lymphocytic colitis 2021  ? OSA on CPAP   ? Osteoarthritis   ? Osteopenia   ? Prediabetes   ? Prostate cancer ([place])   ? PVD (peripheral vascular disease) ([place])   ? Renal cyst, left   ? ? ?Past Surgical History:  ?Procedure Laterality Date  ? COLONOSCOPY N/A 09/07/2020  ? Procedure: COLONOSCOPY;  Surgeon: [name], MD;  Location: [facility];  Service: Endoscopy;  Laterality: N/A;  ? CYSTOSCOPY MACROPLASTIQUE IMPLANT N/A 07/15/2021  ? Procedure: CYSTOSCOPY MACROPLASTIQUE IMPLANT;  Surgeon: [name], MD;  Location: [facility];  Service: Urology;  Laterality: N/A;  ? KNEE ARTHROSCOPY Right 1999  ? PROSTATE SURGERY N/A 2018  ? RALP  ? UMBILICAL HERNIA REPAIR N/A 04/2019  ? ? ?There were no vitals filed for this visit. ? ? Subjective Assessment - 02/10/22 1052   ? ? Subjective Patient reports doing well after his last visit. He  denies new issues at arrival to PT. Patient reports no recent safety concerns. Patient reports fair compliance with his HEP. He is continuing participation with gym-based exercise with focus on low-impact cardiovascular work (recumbent bike, treadmill).   ? Pertinent History Patient is a 71 year old male referred for physical deconditioning. Patient reports that he was doing PT in [place] in 2022. Patient reports having trouble with balance. He reports difficulty with transferring and gait stability. 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 Patient reports difficulty with getting up (his wife states that all seats are low relative to patient's height). Patient's wife feels that his strength got worse following COVID-19 infection. Pt denies difficulty with SOB/aerobic endurance. Patient reports he had lumbar spine injury in 2015 - patient reports having difficulty with walking since then. Patient reports no vertigo/dizziness. Pt reports no major visual deficits - wife reports he does have cataracts. One fall in last 6 months (Sept 2022); fell going down back steps (patio in back of home - low lying handrail). Patient reports Hx of prostate cancer and history of prostatectomy.  Patient has 3 steps to get into home from front porch with single banister. Garage entrance has 3 steps to get in also with banister on either side. Home is one level. Pt  has walk-in shower; pt has grab bars, textured bathroom floor. Patient has to traverse concrete only to get into home. Pt feels that he has to have handrail to traverse stairs. Pt reports that since having lumbar radiculopathy, he drag his R toes sometimes. He reports bilateral numbness in his feet without specific digits/toes involved. Patient reports hx of R knee OA and MD has recommended TKA.   Recent changes in overall health/medication: Yes, COVID-19 infection in 2022; pt reports worsening weakness since then.   ? Limitations Walking;House hold  activities   ? Patient Stated Goals able to walk better, able to get out of chair more readily   ? ?  ?  ? ?  ? ? ? ?  ?There.ex ?  ?Nu-Step L6 for 5 minutes for UE/LE use for LE strength and cardiopulmonary endurance. - interval subjective obtained during this time, 3 minutes unbilled ?         ?Patient education: Discussed role of low-impact aerobic exercise in long-term joint and cartilage health, benefits of continue regular exercise regimen  ? ?*not today* ?Forward step-up with hurdle stance at top; 6-inch step; 1x10 on L, 1x6 on R with intermittent buckling and LOB, ModA to prevent fall with transient increase in R knee pain  ?Standing hip abduction with 4# AW's, 2x10/LE with UE support  ?Standing hip extension with 4# AW's, 2x10/LE with UE support. VC's for maintaining neutral lumbar positioning  ?Standing hip flexion with 4# AW's: 2x10/LE with UE support. VC's for eccentric control due and upright posture ?            Seated LAQ: R/L knee 5# AW's with 3 sec holds at contraction. 2x10/LE.  ?  ?  ?  ?Neuro Re-Ed - postural control and dynamic balance training to reduce risk of falling, motor control training to toe clearance and obstacle negotiation ?  ?In // bars: Forward stepping with alternating lateral cone tap; 4 cones on R and L; 4x D/B ?  ?            Consecutive step up/down on Airex pads; 3 Airex pads in // bars; x4 D/B - verbal cueing for sequencing and heel clearance with trailing leg ? ?Semitandem stance on Airex; 1x30 seconds bilateral ? ?[place] eyes closed; 4x10sec ?  ?5-way mini-lunge on blue star; forward, anterolateral, lateral, posterolateral, posterior; x 5 each direction, bilateral LE ?  ?Sit to stand from chair + Airex pad (pad on seat for decreased depth), with 6-lb Goblet hold; 2x10 ? ?Sidestepping on Airex pad with bilateral UE light touch on single parallel bar; 4x D/B ?  ?  ?*not today* ?Tandem walking:  X5 single finger support. Supervision level of assist. ?Toe-tap with 3 inverted  cones at // bars on floor: x 1 minutes with alternating LE with tapping color called by PT in various orders and 1-2 finger support. CGA/supervision.  ?Forward hurdle (6") and long Airex (oriented horizontally) step-over; 2x D/B with RLE leading, 2x D/B with LLE leading ?In // bars: Dynamic march; x4 D/B ?Standing semitandem on Airex pad (2 Airex pads adjacent to accommodate foot size); 2x25sec, bilateral; intermittent 1-2 finger touch on bar prn ?  Airex beam: Lateral marches requiring BUE support for increased hip flexion and increased time in SLS: x8. CGA. ?  ?  ?  ?  ?ASSESSMENT ?Patient has ongoing difficulty with sit to stand. He is fortunately participating regularly in gym-based workout regimen; however, he does have inconsistent compliance with given home exercises. Patient  has notably progressed with LE strengthening, balance, and equilibrium coordination drills integrated into his therapy to date with increasing demands on postural control and muscle performance. Patient has made good progress, but he still needs further work on LE strengthening and balance. Patient will benefit from continued skilled therapeutic intervention to address the above deficits as needed for improved function, fall risk, and QoL.  ?  ?  ? ? ? ? PT Short Term Goals - 01/29/22 2126   ? ?  ? PT SHORT TERM GOAL #1  ? Title Pt will be independent with HEP in order to improve strength and balance in order to decrease fall risk and improve function at home and work.   ? Baseline 12/28/21: Baseline HEP initiated  01/27/22: Pt compliant with initial HEP, intermittent non-compliance with updated exercises.   ? Time 3   ? Period Weeks   ? Status On-going   ? Target Date 02/10/22   ?  ? PT SHORT TERM GOAL #2  ? Title Patient will perform independent sit to stand from standard-height chair without upper extremity support required to initiate transfer as needed for functional mobility to access home and community   ? Baseline 12/28/21: UE assist  required for sit to stand with intermittent incompletion of transfer.  01/27/22: Pt able to perform with notable momentum to facilitate initial lift from seat and multiple attempts   ? Time 4   ? Period Week

## 2022-02-10 ENCOUNTER — Other Ambulatory Visit: Payer: Self-pay

## 2022-02-10 ENCOUNTER — Encounter: Payer: Self-pay | Admitting: Physical Therapy

## 2022-02-10 ENCOUNTER — Ambulatory Visit: Payer: Medicare Other | Admitting: Physical Therapy

## 2022-02-10 DIAGNOSIS — R5381 Other malaise: Secondary | ICD-10-CM

## 2022-02-10 DIAGNOSIS — M6281 Muscle weakness (generalized): Secondary | ICD-10-CM

## 2022-02-10 DIAGNOSIS — R2689 Other abnormalities of gait and mobility: Secondary | ICD-10-CM

## 2022-02-10 DIAGNOSIS — R262 Difficulty in walking, not elsewhere classified: Secondary | ICD-10-CM

## 2022-02-10 NOTE — Therapy (Signed)
[place] ?[facility] ?[address]. [name], [place], [address] ?Phone: [phone]   Fax:  [phone] ? ?Physical Therapy Treatment ? ?Patient Details  ?Name: Chad Norman ?MRN: [id number] ?Date of Birth: September 19, 1951 ?Referring Provider (PT): [name] ? ? ?Encounter Date: 02/10/2022 ? ? PT End of Session - 02/12/22 2135   ? ? Visit Number 14   ? Number of Visits 17   ? Date for PT Re-Evaluation 02/24/22   ? Authorization Type UHC Medicare   ? Authorization Time Period VL based on medical necessity; prog note 01/27/22   ? Progress Note Due on Visit 17   ? PT Start Time 1324   ? PT Stop Time 1600   ? PT Time Calculation (min) 43 min   ? Equipment Utilized During Treatment Gait belt   ? Activity Tolerance Patient tolerated treatment well   ? Behavior During Therapy [facility] for tasks assessed/performed   ? ?  ?  ? ?  ? ? ?Past Medical History:  ?Diagnosis Date  ? Aortic atherosclerosis ([place])   ? B12 deficiency   ? HLD (hyperlipidemia)   ? Hypertension   ? Hypertensive heart disease with chronic diastolic congestive heart failure ([place])   ? Lymphocytic colitis 2021  ? OSA on CPAP   ? Osteoarthritis   ? Osteopenia   ? Prediabetes   ? Prostate cancer ([place])   ? PVD (peripheral vascular disease) ([place])   ? Renal cyst, left   ? ? ?Past Surgical History:  ?Procedure Laterality Date  ? COLONOSCOPY N/A 09/07/2020  ? Procedure: COLONOSCOPY;  Surgeon: [name], MD;  Location: [facility] ENDOSCOPY;  Service: Endoscopy;  Laterality: N/A;  ? CYSTOSCOPY MACROPLASTIQUE IMPLANT N/A 07/15/2021  ? Procedure: CYSTOSCOPY MACROPLASTIQUE IMPLANT;  Surgeon: [name], MD;  Location: [facility] ORS;  Service: Urology;  Laterality: N/A;  ? KNEE ARTHROSCOPY Right 1999  ? PROSTATE SURGERY N/A 2018  ? RALP  ? UMBILICAL HERNIA REPAIR N/A 04/2019  ? ? ?There were no vitals filed for this visit. ? ? Subjective Assessment - 02/12/22 2134   ? ? Subjective Patient reports no significant complaints at  arrival to PT. Patient reports no major issues after his last session. Patient reports difficulty with sit to stand from low surfaces at this time.   ? Pertinent History Patient is a 70 year old male referred for physical deconditioning. Patient reports that he was doing PT in [place] in 2022. Patient reports having trouble with balance. He reports difficulty with transferring and gait stability. 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 Patient reports difficulty with getting up (his wife states that all seats are low relative to patient's height). Patient's wife feels that his strength got worse following COVID-19 infection. Pt denies difficulty with SOB/aerobic endurance. Patient reports he had lumbar spine injury in 2015 - patient reports having difficulty with walking since then. Patient reports no vertigo/dizziness. Pt reports no major visual deficits - wife reports he does have cataracts. One fall in last 6 months (Sept 2022); fell going down back steps (patio in back of home - low lying handrail). Patient reports Hx of prostate cancer and history of prostatectomy.  Patient has 3 steps to get into home from front porch with single banister. Garage entrance has 3 steps to get in also with banister on either side. Home is one level. Pt has walk-in shower; pt has grab bars, textured bathroom floor. Patient has to traverse  concrete only to get into home. Pt feels that he has to have handrail to traverse stairs. Pt reports that since having lumbar radiculopathy, he drag his R toes sometimes. He reports bilateral numbness in his feet without specific digits/toes involved. Patient reports hx of R knee OA and MD has recommended TKA.   Recent changes in overall health/medication: Yes, COVID-19 infection in 2022; pt reports worsening weakness since then.   ? Limitations Walking;House hold activities   ? Patient Stated Goals able to walk better, able to get out of chair more readily   ? ?  ?  ? ?   ? ? ? ? ?  ?There.ex ?  ?Nu-Step L6 for 5 minutes for UE/LE use for LE strength and cardiopulmonary endurance. - interval subjective obtained during this time, 2 minutes unbilled ?         ? ? ?  ?*not today* ?Forward step-up with hurdle stance at top; 6-inch step; 1x10 on L, 1x6 on R with intermittent buckling and LOB, ModA to prevent fall with transient increase in R knee pain  ?Standing hip abduction with 4# AW's, 2x10/LE with UE support  ?Standing hip extension with 4# AW's, 2x10/LE with UE support. VC's for maintaining neutral lumbar positioning  ?Standing hip flexion with 4# AW's: 2x10/LE with UE support. VC's for eccentric control due and upright posture ?            Seated LAQ: R/L knee 5# AW's with 3 sec holds at contraction. 2x10/LE.  ?  ?  ?  ?Neuro Re-Ed - postural control and dynamic balance training to reduce risk of falling, motor control training to toe clearance and obstacle negotiation ?  ?In // bars: Forward stepping with alternating lateral cone tap; 4 cones on R and L; 4x D/B ?  ?            Consecutive step up/down on Airex pads; 3 Airex pads in // bars; x4 D/B - verbal cueing for sequencing and heel clearance with trailing leg ?  ?Semitandem stance on Airex; 1x30 seconds bilateral ?  ?Romberg eyes closed; 2x30sec ?  ?5-way mini-lunge on blue star; forward, anterolateral, lateral, posterolateral, posterior; x 5 each direction, bilateral LE ?  ?Sit to stand from chair + Airex pad (pad on seat for decreased depth), with 8-lb [place] hold; 2x10 ?  ?Sidestepping on Airex pad with bilateral UE light touch on single parallel bar; 4x D/B ?  ?  ?*not today* ?Tandem walking:  X5 single finger support. Supervision level of assist. ?Toe-tap with 3 inverted cones at // bars on floor: x 1 minutes with alternating LE with tapping color called by PT in various orders and 1-2 finger support. CGA/supervision.  ?Forward hurdle (6") and long Airex (oriented horizontally) step-over; 2x D/B with RLE leading, 2x D/B  with LLE leading ?In // bars: Dynamic march; x4 D/B ?Standing semitandem on Airex pad (2 Airex pads adjacent to accommodate foot size); 2x25sec, bilateral; intermittent 1-2 finger touch on bar prn ?  Airex beam: Lateral marches requiring BUE support for increased hip flexion and increased time in SLS: x8. CGA. ?  ?  ?  ?  ?ASSESSMENT ?Patient demonstrates improving ability to perform sit to stand, but he is still notably challenged with transferring from low surface (e.g. commode). He demonstrates improving ability to maintain static postural control with varying demands on sensory integration as needed for maintaining standing balance during church service/choir participation. Pt needs further work on LE strengthening, toe clearance/obstacle  negotiation, and postural control. Patient will benefit from continued skilled therapeutic intervention to address the above deficits as needed for improved function, fall risk, and QoL.  ?  ? PT Short Term Goals - 01/29/22 2126   ? ?  ? PT SHORT TERM GOAL #1  ? Title Pt will be independent with HEP in order to improve strength and balance in order to decrease fall risk and improve function at home and work.   ? Baseline 12/28/21: Baseline HEP initiated  01/27/22: Pt compliant with initial HEP, intermittent non-compliance with updated exercises.   ? Time 3   ? Period Weeks   ? Status On-going   ? Target Date 02/10/22   ?  ? PT SHORT TERM GOAL #2  ? Title Patient will perform independent sit to stand from standard-height chair without upper extremity support required to initiate transfer as needed for functional mobility to access home and community   ? Baseline 12/28/21: UE assist required for sit to stand with intermittent incompletion of transfer.  01/27/22: Pt able to perform with notable momentum to facilitate initial lift from seat and multiple attempts   ? Time 4   ? Period Weeks   ? Status Partially Met   ? Target Date 02/17/22   ? ?  ?  ? ?  ? ? ? ? PT Long Term Goals -  01/29/22 2128   ? ?  ? PT LONG TERM GOAL #1  ? Title Pt will improve BERG to 45 or greater in order to demonstrate clinically significant improvement in balance and to meet cut-off score indicative of no increased

## 2022-02-11 ENCOUNTER — Ambulatory Visit
Admission: RE | Admit: 2022-02-11 | Discharge: 2022-02-11 | Disposition: A | Discharge status: Home / Self Care | Payer: Medicare Other | Source: Ambulatory Visit | Attending: Gerontology | Admitting: Gerontology

## 2022-02-11 DIAGNOSIS — R053 Chronic cough: Secondary | ICD-10-CM | POA: Diagnosis not present

## 2022-02-11 DIAGNOSIS — Z87891 Personal history of nicotine dependence: Secondary | ICD-10-CM

## 2022-02-15 ENCOUNTER — Other Ambulatory Visit: Payer: Self-pay

## 2022-02-15 ENCOUNTER — Ambulatory Visit: Payer: Medicare Other | Admitting: Physical Therapy

## 2022-02-15 ENCOUNTER — Encounter: Payer: Self-pay | Admitting: Physical Therapy

## 2022-02-15 DIAGNOSIS — R5381 Other malaise: Secondary | ICD-10-CM

## 2022-02-15 DIAGNOSIS — R2689 Other abnormalities of gait and mobility: Secondary | ICD-10-CM

## 2022-02-15 DIAGNOSIS — M6281 Muscle weakness (generalized): Secondary | ICD-10-CM

## 2022-02-15 DIAGNOSIS — R262 Difficulty in walking, not elsewhere classified: Secondary | ICD-10-CM

## 2022-02-15 NOTE — Therapy (Signed)
?OUTPATIENT PHYSICAL THERAPY TREATMENT NOTE ? ? ?Patient Name: Chad Norman ?MRN: [id number] ?DOB:1951/01/19, 71 y.o., male ?Today's Date: 02/17/2022 ? ?PCP: Latanya Maudlin, NP ?REFERRING PROVIDER: Latanya Maudlin, NP ? ? PT End of Session - 02/17/22 1238   ? ? Visit Number 15   ? Number of Visits 17   ? Date for PT Re-Evaluation 02/24/22   ? Authorization Type UHC Medicare   ? Authorization Time Period VL based on medical necessity; prog note 01/27/22   ? Progress Note Due on Visit 17   ? PT Start Time 1515   ? PT Stop Time 1558   ? PT Time Calculation (min) 43 min   ? Equipment Utilized During Treatment Gait belt   ? Activity Tolerance Patient tolerated treatment well   ? Behavior During Therapy [facility] for tasks assessed/performed   ? ?  ?  ? ?  ? ? ?Past Medical History:  ?Diagnosis Date  ? Aortic atherosclerosis ([place])   ? B12 deficiency   ? HLD (hyperlipidemia)   ? Hypertension   ? Hypertensive heart disease with chronic diastolic congestive heart failure ([place])   ? Lymphocytic colitis 2021  ? OSA on CPAP   ? Osteoarthritis   ? Osteopenia   ? Prediabetes   ? Prostate cancer ([place])   ? PVD (peripheral vascular disease) ([place])   ? Renal cyst, left   ? ?Past Surgical History:  ?Procedure Laterality Date  ? COLONOSCOPY N/A 09/07/2020  ? Procedure: COLONOSCOPY;  Surgeon: [name], MD;  Location: [facility] ENDOSCOPY;  Service: Endoscopy;  Laterality: N/A;  ? CYSTOSCOPY MACROPLASTIQUE IMPLANT N/A 07/15/2021  ? Procedure: CYSTOSCOPY MACROPLASTIQUE IMPLANT;  Surgeon: [name], MD;  Location: [facility] ORS;  Service: Urology;  Laterality: N/A;  ? KNEE ARTHROSCOPY Right 1999  ? PROSTATE SURGERY N/A 2018  ? RALP  ? UMBILICAL HERNIA REPAIR N/A 04/2019  ? ?There are no problems to display for this patient. ? ? ?REFERRING DIAG: R53.81 (ICD-10-CM) - Physical deconditioning ? ?THERAPY DIAG:  ?Physical deconditioning ? ?Imbalance ? ?Difficulty in walking, not elsewhere classified ? ?Muscle weakness  (generalized) ? ?PERTINENT HISTORY: Patient is a 71 year old male referred for physical deconditioning. Patient reports that he was doing PT in [place] in 2022. Patient reports having trouble with balance. He reports difficulty with transferring and gait stability. 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 Patient reports difficulty with getting up (his wife states that all seats are low relative to patient's height). Patient's wife feels that his strength got worse following COVID-19 infection. Pt denies difficulty with SOB/aerobic endurance. Patient reports he had lumbar spine injury in 2015 - patient reports having difficulty with walking since then. Patient reports no vertigo/dizziness. Pt reports no major visual deficits - wife reports he does have cataracts. One fall in last 6 months (Sept 2022); fell going down back steps (patio in back of home - low lying handrail). Patient reports Hx of prostate cancer and history of prostatectomy.  Patient has 3 steps to get into home from front porch with single banister. Garage entrance has 3 steps to get in also with banister on either side. Home is one level. Pt has walk-in shower; pt has grab bars, textured bathroom floor. Patient has to traverse concrete only to get into home. Pt feels that he has to have handrail to traverse stairs. Pt reports that since having lumbar radiculopathy, he drag his R toes sometimes. He reports bilateral numbness in his  feet without specific digits/toes involved. Patient reports hx of R knee OA and MD has recommended TKA.   Recent changes in overall health/medication: Yes, COVID-19 infection in 2022; pt reports worsening weakness since then.  ? ?PRECAUTIONS: Fall ? ?SUBJECTIVE: Patient reports feeling generally well at arrival. He denies notable issue after his last session. He is still participating in low-impact aerobic exercise regimen at [employer]. Patient denies recent fall or major concern today.  ? ?PAIN:   ?Are you having pain? No ? ? ? ? ?TODAY'S TREATMENT:  ?  ?  ?There.ex ?  ?Nu-Step L6 for 5 minutes for UE/LE use for LE strength and cardiopulmonary endurance. - interval subjective obtained during this time, 2 minutes unbilled ?         ?  ?  ?  ?*not today* ?Forward step-up with hurdle stance at top; 6-inch step; 1x10 on L, 1x6 on R with intermittent buckling and LOB, ModA to prevent fall with transient increase in R knee pain  ?Standing hip abduction with 4# AW's, 2x10/LE with UE support  ?Standing hip extension with 4# AW's, 2x10/LE with UE support. VC's for maintaining neutral lumbar positioning  ?Standing hip flexion with 4# AW's: 2x10/LE with UE support. VC's for eccentric control due and upright posture ?            Seated LAQ: R/L knee 5# AW's with 3 sec holds at contraction. 2x10/LE.  ?  ?  ?  ?Neuro Re-Ed - postural control and dynamic balance training to reduce risk of falling, motor control training to toe clearance and obstacle negotiation ?  ?5-way mini-lunge on blue star; forward, anterolateral, lateral, posterolateral, posterior; x 5 each direction, bilateral LE ? ?In // bars: Forward stepping with alternating lateral cone tap; 4 cones on R and L; 4x D/B ?High knees 4x D/B with verbal cueing for softer landing ?  ?            Consecutive step up/over Airex pads; 3 Airex pads in // bars; x4 D/B - verbal cueing for sequencing and heel clearance with trailing leg ?  ? Attempted Airex Tandem walk ?Tandem walking:  X5 single finger support. Supervision level of assist. ?  ?Romberg eyes closed; 2x30sec ?  ?Sit to stand from chair without Airex pad; 1x5 with multiple attempts required ? ?Sit to stand with TRX with slow eccentic; 2x10, verbal cueing for light UE assist ?  ?  ?*not today* ?Semitandem stance on Airex; 1x30 seconds bilateral ?Sidestepping on Airex pad with bilateral UE light touch on single parallel bar; 4x D/B ?Toe-tap with 3 inverted cones at // bars on floor: x 1 minutes with alternating  LE with tapping color called by PT in various orders and 1-2 finger support. CGA/supervision.  ?Forward hurdle (6") and long Airex (oriented horizontally) step-over; 2x D/B with RLE leading, 2x D/B with LLE leading ?In // bars: Dynamic march; x4 D/B ?Standing semitandem on Airex pad (2 Airex pads adjacent to accommodate foot size); 2x25sec, bilateral; intermittent 1-2 finger touch on bar prn ?  Airex beam: Lateral marches requiring BUE support for increased hip flexion and increased time in SLS: x8. CGA. ?  ?  ? ? ? ?HOME EXERCISE PROGRAM: ?Access Code [email or web address] ? ? PT Short Term Goals   ? ?  ? PT SHORT TERM GOAL #1  ? Title Pt will be independent with HEP in order to improve strength and balance in order to decrease fall risk and improve function at home and  work.   ? Baseline 12/28/21: Baseline HEP initiated  01/27/22: Pt compliant with initial HEP, intermittent non-compliance with updated exercises.   ? Time 3   ? Period Weeks   ? Status On-going   ? Target Date 02/10/22   ?  ? PT SHORT TERM GOAL #2  ? Title Patient will perform independent sit to stand from standard-height chair without upper extremity support required to initiate transfer as needed for functional mobility to access home and community   ? Baseline 12/28/21: UE assist required for sit to stand with intermittent incompletion of transfer.  01/27/22: Pt able to perform with notable momentum to facilitate initial lift from seat and multiple attempts   ? Time 4   ? Period Weeks   ? Status Partially Met   ? Target Date 02/17/22   ? ?  ?  ? ?  ? ? ? PT Long Term Goals  ? ?  ? PT LONG TERM GOAL #1  ? Title Pt will improve BERG to 45 or greater in order to demonstrate clinically significant improvement in balance and to meet cut-off score indicative of no increased risk of falls   ? Baseline 12/28/21: BERG 40/56.    01/27/22: BERG 44/56.   ? Time 8   ? Period Weeks   ? Status Partially Met   improved score, clinically meaningful change obtained  ? Target Date 02/24/22    ?  ? PT LONG TERM GOAL #2  ? Title Pt will decrease TUG to below 14 seconds/decrease in order to demonstrate decreased fall risk.   ? Baseline 12/28/21: 17.5 sec.   01/27/22: 13 sec.   ? Time 8   ? Period Weeks   ? Status Ach

## 2022-02-16 ENCOUNTER — Other Ambulatory Visit: Payer: Self-pay | Admitting: Gerontology

## 2022-02-16 DIAGNOSIS — J42 Unspecified chronic bronchitis: Secondary | ICD-10-CM | POA: Insufficient documentation

## 2022-02-16 DIAGNOSIS — N281 Cyst of kidney, acquired: Secondary | ICD-10-CM

## 2022-02-17 ENCOUNTER — Encounter: Payer: Self-pay | Admitting: Physical Therapy

## 2022-02-17 ENCOUNTER — Ambulatory Visit: Payer: Medicare Other | Admitting: Physical Therapy

## 2022-02-17 DIAGNOSIS — R5381 Other malaise: Secondary | ICD-10-CM

## 2022-02-17 DIAGNOSIS — R2689 Other abnormalities of gait and mobility: Secondary | ICD-10-CM

## 2022-02-17 DIAGNOSIS — R262 Difficulty in walking, not elsewhere classified: Secondary | ICD-10-CM

## 2022-02-17 DIAGNOSIS — M6281 Muscle weakness (generalized): Secondary | ICD-10-CM

## 2022-02-17 NOTE — Therapy (Signed)
?OUTPATIENT PHYSICAL THERAPY TREATMENT AND PROGRESS NOTE ? ? ?Dates of reporting period  01/27/22   to   02/17/22 ? ? ? ?Patient Name: Chad Norman ?MRN: [id number] ?DOB:10-07-1951, 71 y.o., male ?Today's Date: 02/17/2022 ? ?PCP: Latanya Maudlin, NP ?REFERRING PROVIDER: Latanya Maudlin, NP ? ? PT End of Session - 02/17/22 1616   ? ? Visit Number 16   ? Number of Visits 17   ? Date for PT Re-Evaluation 02/24/22   ? Authorization Type [employer] Medicare   ? Authorization Time Period VL based on medical necessity; prog note 01/27/22   ? Progress Note Due on Visit 17   ? PT Start Time 4166   ? PT Stop Time 0630   ? PT Time Calculation (min) 45 min   ? Equipment Utilized During Treatment Gait belt   ? Activity Tolerance Patient tolerated treatment well   ? Behavior During Therapy [facility] for tasks assessed/performed   ? ?  ?  ? ?  ? ? ?Past Medical History:  ?Diagnosis Date  ? Aortic atherosclerosis ([place])   ? B12 deficiency   ? HLD (hyperlipidemia)   ? Hypertension   ? Hypertensive heart disease with chronic diastolic congestive heart failure ([place])   ? Lymphocytic colitis 2021  ? OSA on CPAP   ? Osteoarthritis   ? Osteopenia   ? Prediabetes   ? Prostate cancer ([place])   ? PVD (peripheral vascular disease) ([place])   ? Renal cyst, left   ? ?Past Surgical History:  ?Procedure Laterality Date  ? COLONOSCOPY N/A 09/07/2020  ? Procedure: COLONOSCOPY;  Surgeon: [name], MD;  Location: [facility] ENDOSCOPY;  Service: Endoscopy;  Laterality: N/A;  ? CYSTOSCOPY MACROPLASTIQUE IMPLANT N/A 07/15/2021  ? Procedure: CYSTOSCOPY MACROPLASTIQUE IMPLANT;  Surgeon: [name], MD;  Location: [facility] ORS;  Service: Urology;  Laterality: N/A;  ? KNEE ARTHROSCOPY Right 1999  ? PROSTATE SURGERY N/A 2018  ? RALP  ? UMBILICAL HERNIA REPAIR N/A 04/2019  ? ?There are no problems to display for this patient. ? ? ?  ?REFERRING DIAG: R53.81 (ICD-10-CM) - Physical deconditioning ?  ?THERAPY DIAG:  ?Physical deconditioning ?  ?Imbalance ?  ?Difficulty in  walking, not elsewhere classified ?  ?Muscle weakness (generalized) ?  ?PERTINENT HISTORY: Patient is a 71 year old male referred for physical deconditioning. Patient reports that he was doing PT in [place] in 2022. Patient reports having trouble with balance. He reports difficulty with transferring and gait stability. 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 Patient reports difficulty with getting up (his wife states that all seats are low relative to patient's height). Patient's wife feels that his strength got worse following COVID-19 infection. Pt denies difficulty with SOB/aerobic endurance. Patient reports he had lumbar spine injury in 2015 - patient reports having difficulty with walking since then. Patient reports no vertigo/dizziness. Pt reports no major visual deficits - wife reports he does have cataracts. One fall in last 6 months (Sept 2022); fell going down back steps (patio in back of home - low lying handrail). Patient reports Hx of prostate cancer and history of prostatectomy.  Patient has 3 steps to get into home from front porch with single banister. Garage entrance has 3 steps to get in also with banister on either side. Home is one level. Pt has walk-in shower; pt has grab bars, textured bathroom floor. Patient has to traverse concrete only to get into home. Pt feels that he has to  have handrail to traverse stairs. Pt reports that since having lumbar radiculopathy, he drag his R toes sometimes. He reports bilateral numbness in his feet without specific digits/toes involved. Patient reports hx of R knee OA and MD has recommended TKA.   Recent changes in overall health/medication: Yes, COVID-19 infection in 2022; pt reports worsening weakness since then.  ?  ?PRECAUTIONS: Fall ?  ?SUBJECTIVE: Patient reports he is "a little bit better," but reports progress is small compared to outset of PT. He states that he wants to be walking better, but he feels that he is limited by R  knee OA. Patient reports 50% SANE score at this time. Patient reports doing better with standing c choir on Sunday. Patient reports doing well with stair negotiation with unilateral or bilateral  handrail support. Patient reports getting up from commode is getting easier.  ? ? ?PAIN:  ?Are you having pain? No ?  ? ? ?OBJECTIVE: ? ?  ?  ?FUNCTIONAL OUTCOME MEASURES ?  ?  ?  ?  Results Comments  ?BERG 12/28/21: 40/56 ?01/27/22: 44/56 ?02/17/22: 45/56 Fall risk, in need of intervention  ?TUG 12/28/21: 17.5 seconds ?01/27/22: 13 seconds    ?5TSTS 12/28/21: 56 seconds  ?01/27/22: 75 seconds  ?02/17/22: 39.1 sec     ?ABC Scale 12/28/21: 56.25% ?01/27/22: 79.4%    ?  ?FOTO Score:  50 ?  ?  ?  ?FUNCTIONAL TASKS ?  ?Sit to stand: Performed without UE assist after 2 attempts with verbal cueing to perform "nose over toes" prior to stand  ?   ?  ? ? ?  ?TODAY'S TREATMENT:  ?  ? ?Therapeutic Activities ? ?Re-assessment performed (see above and updated Goal section below) ? ? ?Stair ascent and descent (4-step in middle of gym); performed with reciprocal pattern and bilateral handrail support x 1; step-to pattern and R unilateral handrail support x2; attempted reciprocal pattern with R unilateral support, but pt is unable to fully shift weight to RLE ?  ?  ?Neuro Re-Ed - postural control and dynamic balance training to reduce risk of falling, motor control training to toe clearance and obstacle negotiation ?  ?            Consecutive step up/over Airex pads; 3 Airex pads in // bars; x4 D/B - verbal cueing for sequencing and heel clearance with trailing leg ?  ?          ? ?  ? ?  ?  ?*not today* ?5-way mini-lunge on blue star; forward, anterolateral, lateral, posterolateral, posterior; x 5 each direction, bilateral LE ?In // bars: Forward stepping with alternating lateral cone tap; 4 cones on R and L; 4x D/B ?Sit to stand from chair without Airex pad; 1x5 with multiple attempts required ?Sit to stand with TRX with slow eccentic; 2x10, verbal cueing  for light UE assist ?Semitandem stance on Airex; 1x30 seconds bilateral ?Sidestepping on Airex pad with bilateral UE light touch on single parallel bar; 4x D/B ?Toe-tap with 3 inverted cones at // bars on floor: x 1 minutes with alternating LE with tapping color called by PT in various orders and 1-2 finger support. CGA/supervision.  ?Forward hurdle (6") and long Airex (oriented horizontally) step-over; 2x D/B with RLE leading, 2x D/B with LLE leading ?In // bars: Dynamic march; x4 D/B ?Standing semitandem on Airex pad (2 Airex pads adjacent to accommodate foot size); 2x25sec, bilateral; intermittent 1-2 finger touch on bar prn ?  Airex beam: Lateral marches requiring BUE  support for increased hip flexion and increased time in SLS: x8. CGA. ?  ?  ?  ?  ?  ?HOME EXERCISE PROGRAM: ?Access Code [email or web address] ?  ?  PT Short Term Goals   ?  ?    ?     ?  PT SHORT TERM GOAL #1  ?  Title Pt will be independent with HEP in order to improve strength and balance in order to decrease fall risk and improve function at home and work.   ?  Baseline 12/28/21: Baseline HEP initiated  01/27/22: Pt compliant with initial HEP, intermittent non-compliance with updated exercises. 02/17/22: Patient is compliant with HEP  ?  Time 3   ?  Period Weeks   ?  Status Achieved  ?  Target Date 02/10/22   ?     ?  PT SHORT TERM GOAL #2  ?  Title Patient will perform independent sit to stand from standard-height chair without upper extremity support required to initiate transfer as needed for functional mobility to access home and community   ?  Baseline 12/28/21: UE assist required for sit to stand with intermittent incompletion of transfer.  01/27/22: Pt able to perform with notable momentum to facilitate initial lift from seat and multiple attempts   02/17/22: Performed without UE support after 2 attempts   ?  Time 4   ?  Period Weeks   ?  Status Partially Met   ?  Target Date 02/17/22   ?  ?   ?  ?  ?   ?  ?  ?  PT Long Term Goals  ?  ?    ?     ?  PT LONG  TERM GOAL #1  ?  Title Pt will improve BERG to 45 or greater in order to demonstrate clinically significant improvement in balance and to meet cut-off score indicative of no increased risk of falls   ?  B

## 2022-03-01 ENCOUNTER — Ambulatory Visit
Admission: RE | Admit: 2022-03-01 | Discharge: 2022-03-01 | Disposition: A | Discharge status: Home / Self Care | Payer: Medicare Other | Source: Ambulatory Visit | Attending: Gerontology | Admitting: Gerontology

## 2022-03-01 DIAGNOSIS — N281 Cyst of kidney, acquired: Secondary | ICD-10-CM | POA: Diagnosis present

## 2022-08-01 IMAGING — CT CT CHEST W/O CM
1 series · 15 of 34 positions shown, 19 images · non-contrast
Comparison: None.

CLINICAL DATA: Chronic cough



[Series 2: thorax · axial · 0.83mm/px · z∈[-539,-259]mm · 15 of 166 slices shown, 19 images]
[im 13/166  mediastinal]
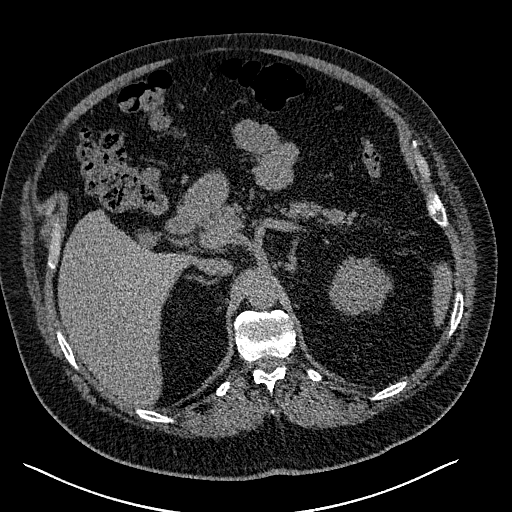
[im 13/166  lung]
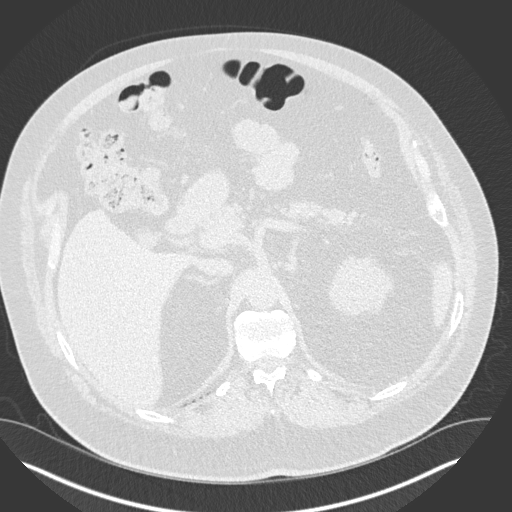
[im 25/166  lung]
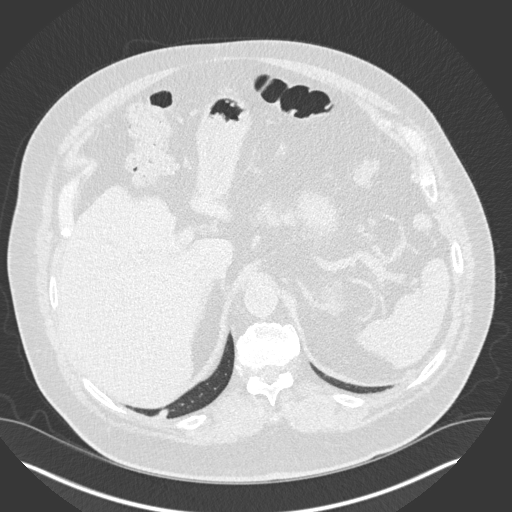
[im 34/166  lung]
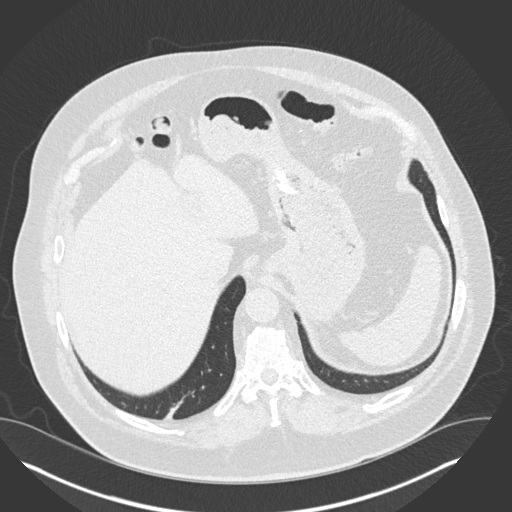
[im 43/166  lung]
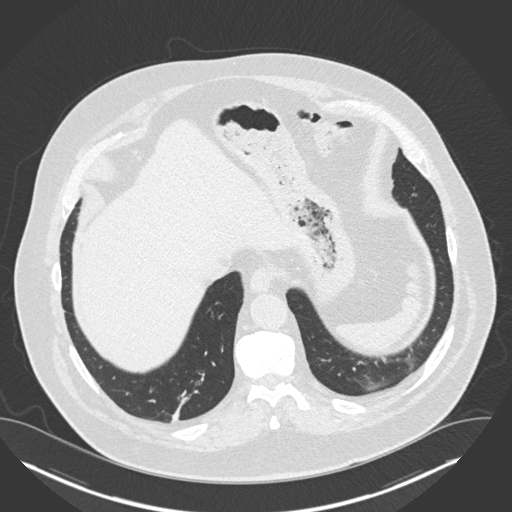
[im 56/166  mediastinal]
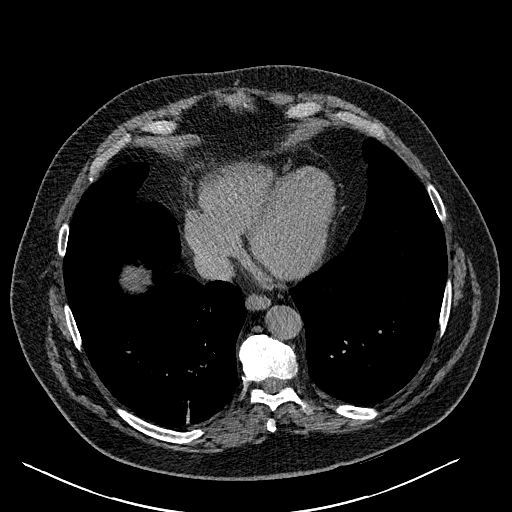
[im 56/166  lung]
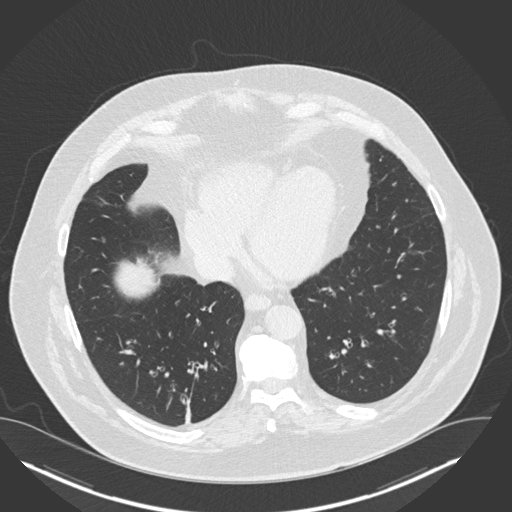
[im 67/166  lung]
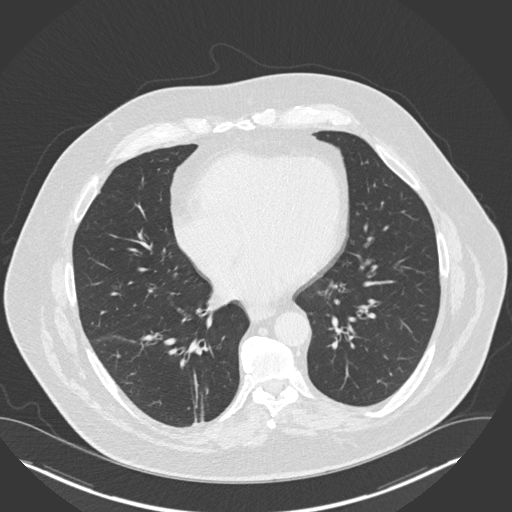
[im 74/166  lung]
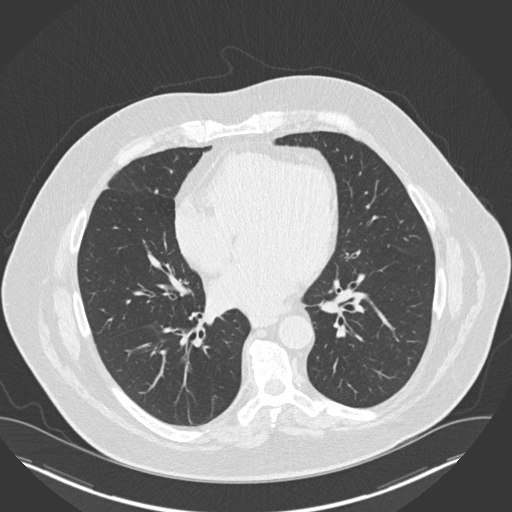
[im 86/166  lung]
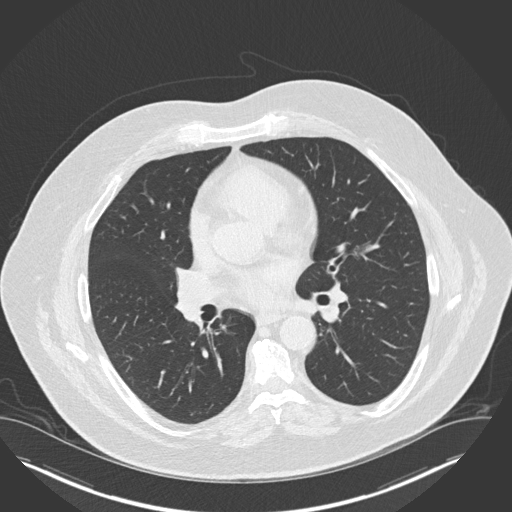
[im 92/166  mediastinal]
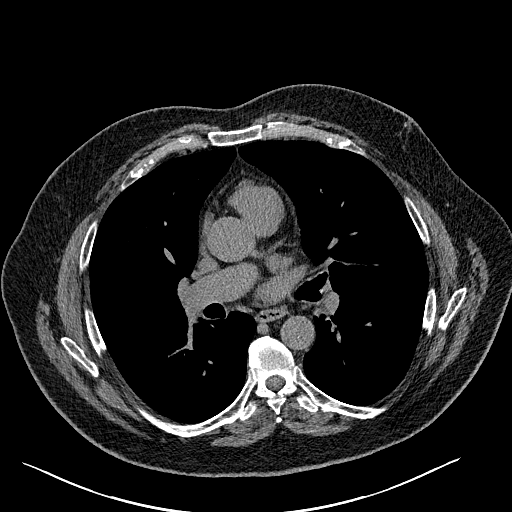
[im 92/166  lung]
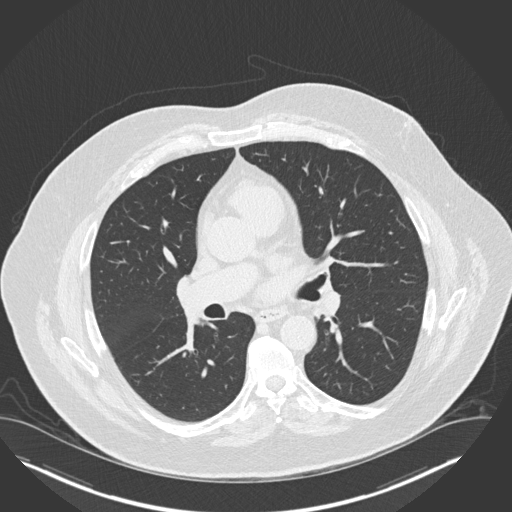
[im 100/166  lung]
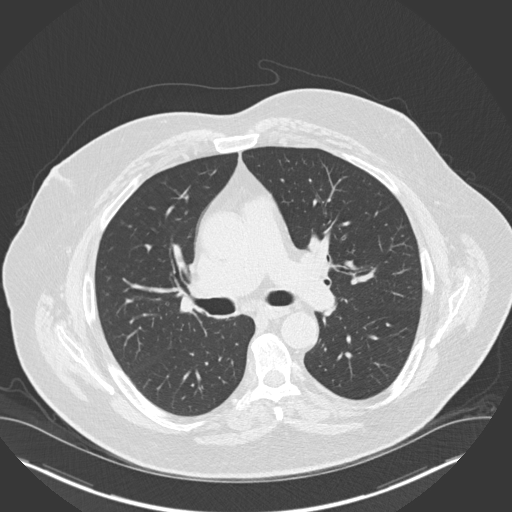
[im 111/166  lung]
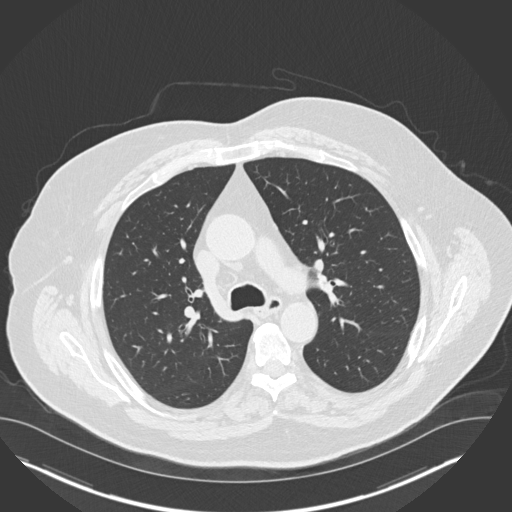
[im 123/166  lung]
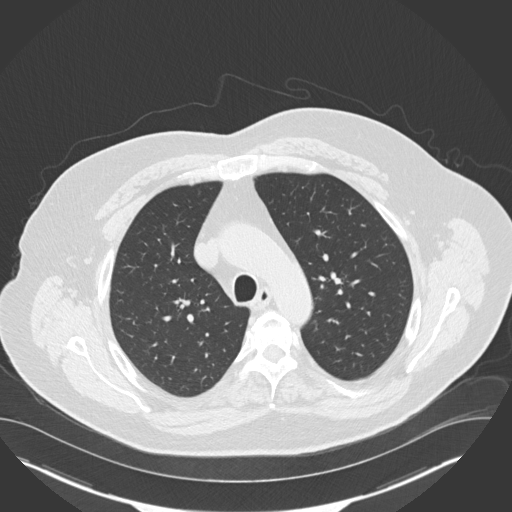
[im 133/166  mediastinal]
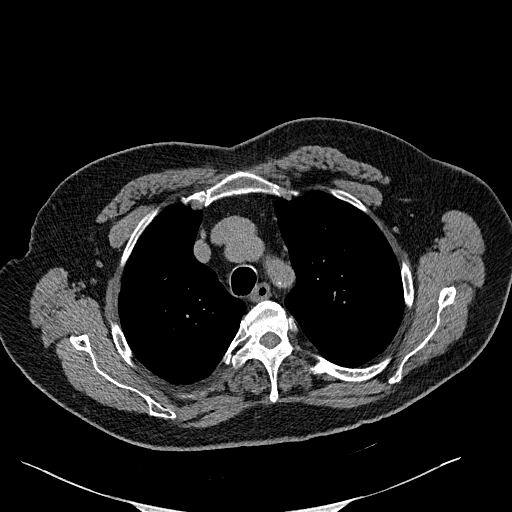
[im 133/166  lung]
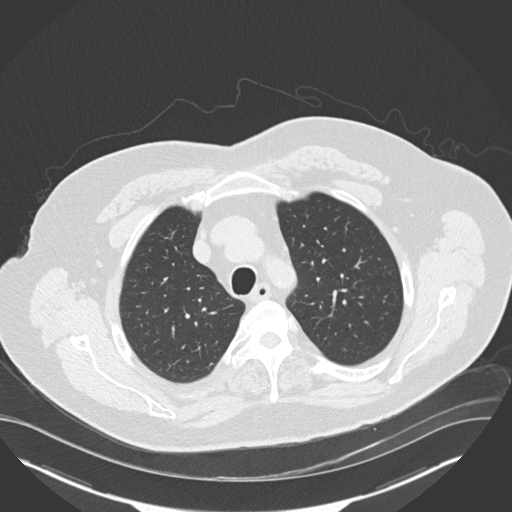
[im 141/166  lung]
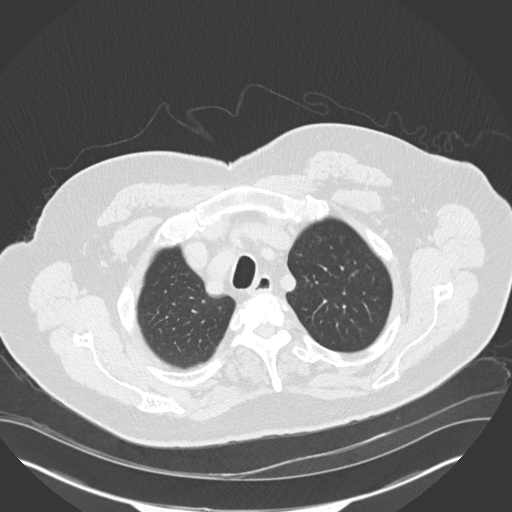
[im 153/166  lung]
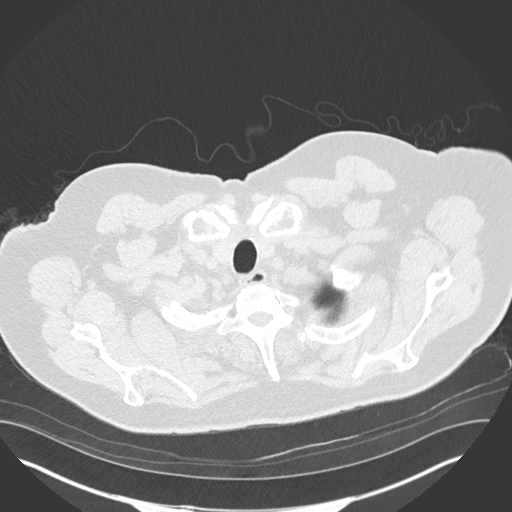

[15 of 34 positions shown; findings below may reference images not displayed]

FINDINGS: Cardiovascular: Limited evaluation in the absence of intravenous
contrast. Two vessel arch anatomy. Right brachiocephalic and left
common carotid artery share a common origin. Scattered
atherosclerotic calcifications. No aneurysm. Main pulmonary artery
within normal limits in size. Heart is normal in size. No
pericardial effusion.

Mediastinum/Nodes: Unremarkable CT appearance of the thyroid gland.
No suspicious mediastinal or hilar adenopathy. No soft tissue
mediastinal mass. The thoracic esophagus is unremarkable.

Lungs/Pleura: Linear scarring within the posterior aspect of the
right lower lobe. Mild bilateral lower lobe bronchial wall
thickening. The lungs are otherwise clear. No suspicious pulmonary
nodule or mass. No focal airspace infiltrate, pleural effusion or
pneumothorax.

Upper Abdomen: No acute abnormality. Small stones visible within the
gallbladder. Partially imaged mass of low attenuation anterior to
the left kidney favored to represent a large cyst.

Musculoskeletal: No acute fracture or aggressive appearing lytic or
blastic osseous lesion.
IMPRESSION: 1. No acute cardiopulmonary process.
2. Diffuse mild bronchial wall thickening in the lower lobes
suggests chronic bronchitis.
3. Aortic atherosclerotic calcifications.
4. Focal linear scarring in the posterior right lower lobe.
5. Suspect partially imaged large cyst arising from the anterior
aspect of the left kidney. This is both incompletely imaged in terms
of coverage as well as in the absence of intravenous contrast.
Consider renal ultrasound for initial further evaluation.

Aortic Atherosclerosis ([id number]-[id number]).

## 2022-08-19 IMAGING — US US RENAL
1 series · 14 of 25 positions shown · non-contrast
Comparison: 02/11/2022

CLINICAL DATA: Left renal abnormality on prior CT

EXAM:
RENAL / URINARY TRACT ULTRASOUND COMPLETE

[Series 1: us renal · 0.28mm/px · 14 of 28 slices shown]
[im 1/28]
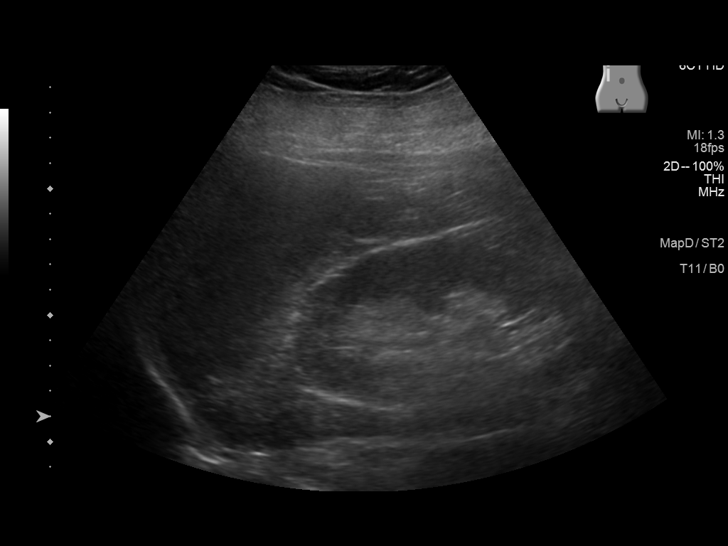
[im 3/28]
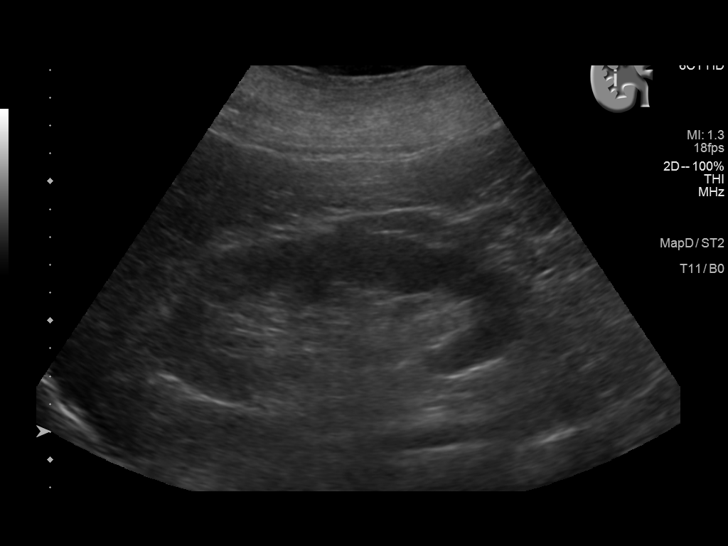
[im 5/28]
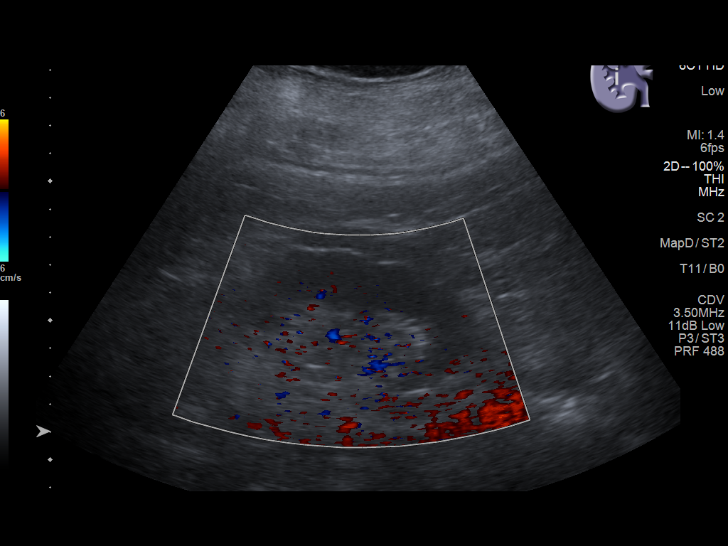
[im 7/28]
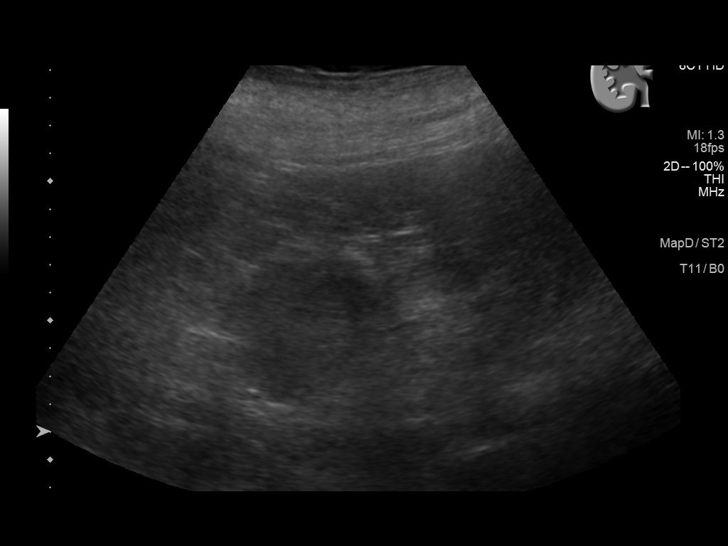
[im 10/28]
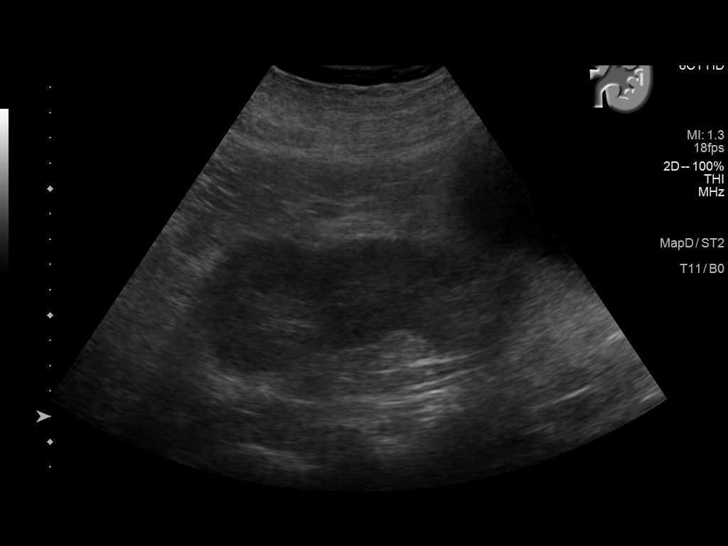
[im 11/28]
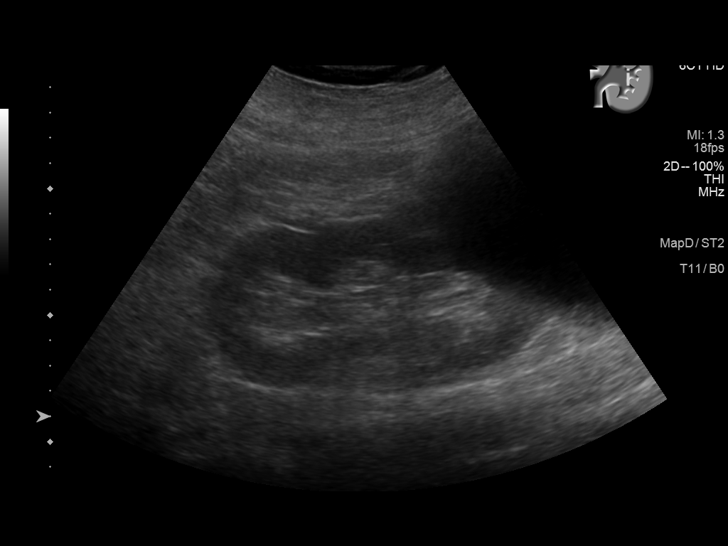
[im 13/28]
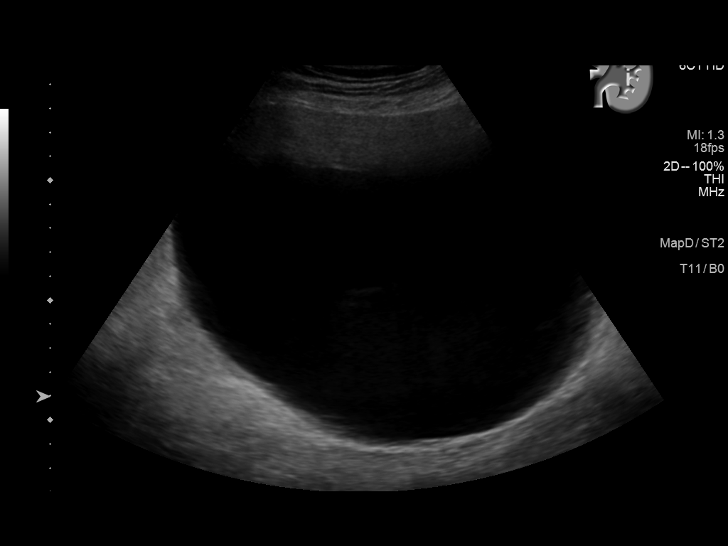
[im 15/28]
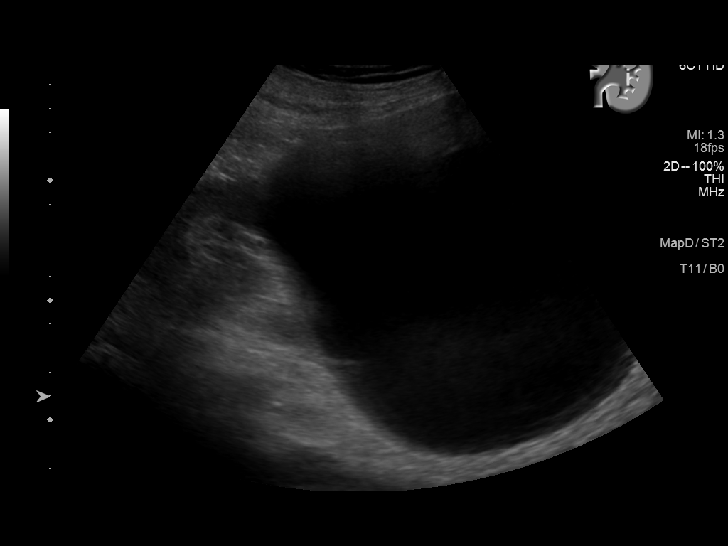
[im 17/28]
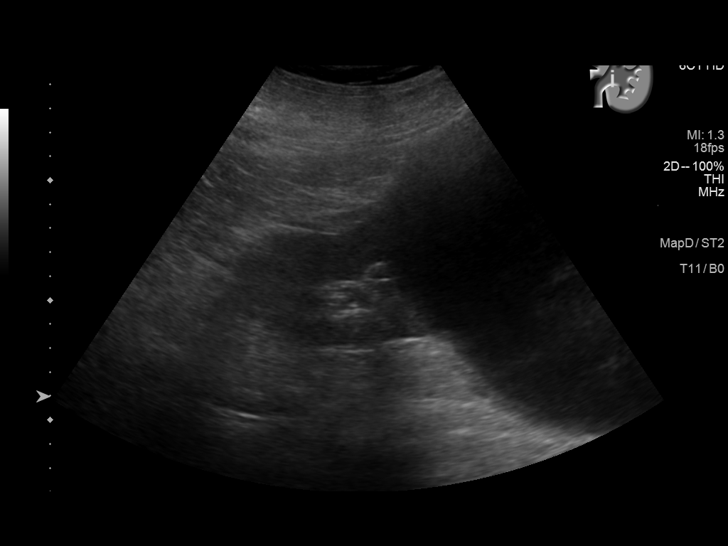
[im 19/28]
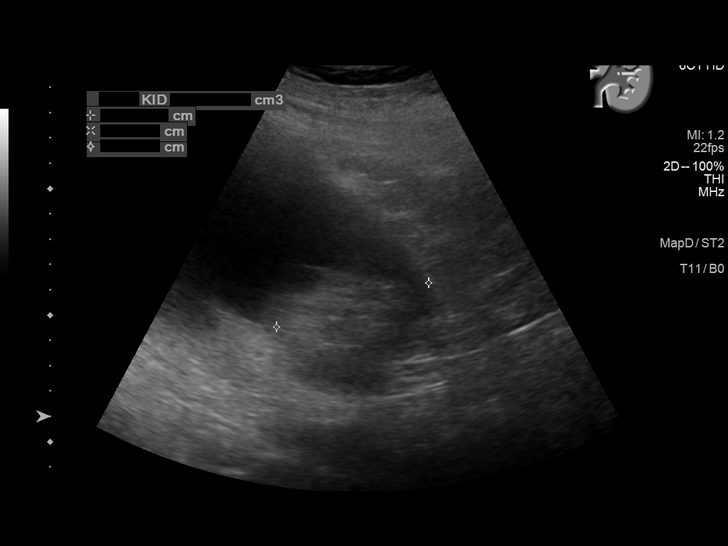
[im 21/28]
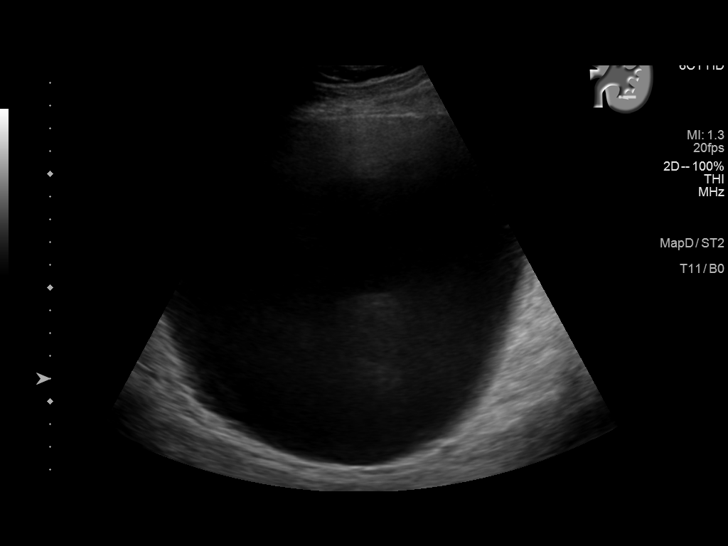
[im 23/28]
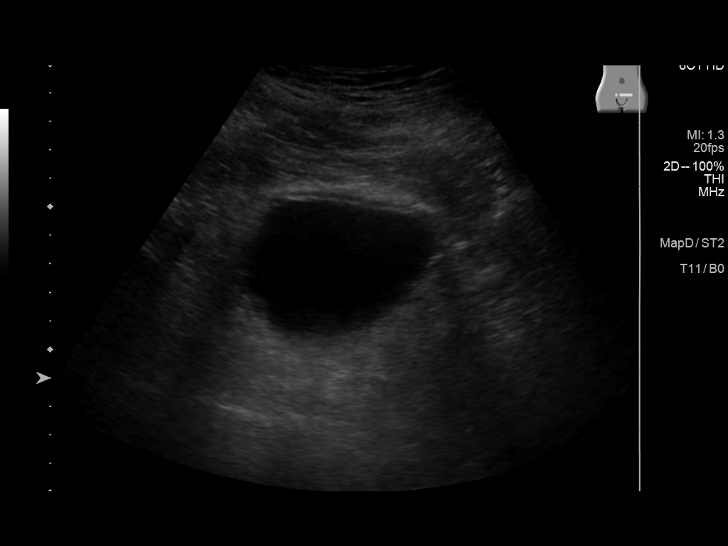
[im 25/28]
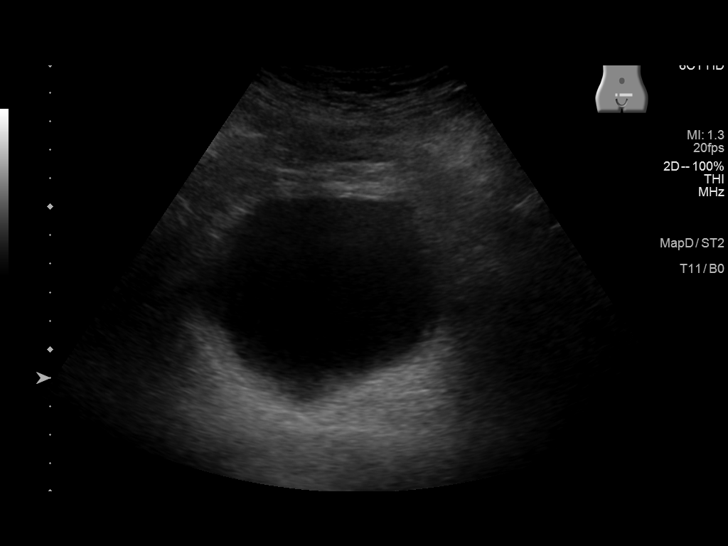
[im 28/28]
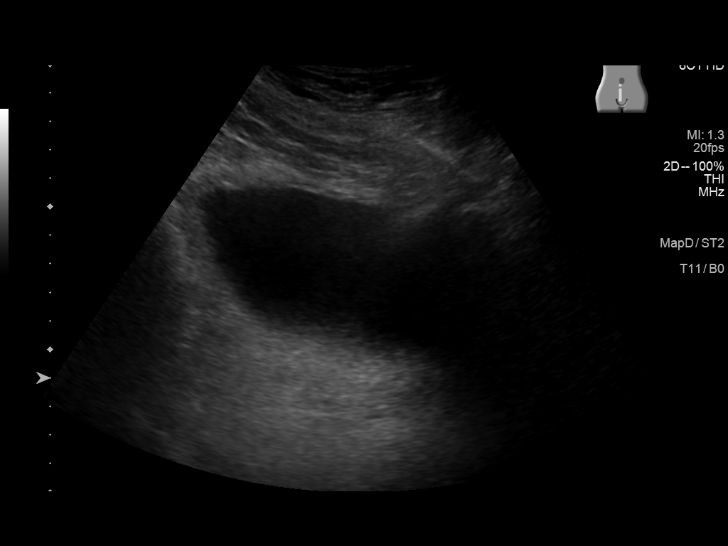

[14 of 25 positions shown; findings below may reference images not displayed]

FINDINGS: Right Kidney:

Renal measurements: 12.9 x 6.2 x 6.1 cm = volume: 257.3 mL.
Echogenicity within normal limits. No mass or hydronephrosis
visualized.

Left Kidney:

Renal measurements: 13.2 x 6.5 x 6.3 cm = volume: 281.5 mL.
Echogenicity within normal limits. Large exophytic simple cyst
measuring 16.2 x 17.4 x 14.1 cm corresponds to the abnormality seen
on prior CT. No internal septations, debris, or wall thickening. No
hydronephrosis.

Bladder:

Appears normal for degree of bladder distention.

Other:

None.
IMPRESSION: 1. Exophytic 17.4 cm a simple left renal cyst, corresponding to
prior CT findings. No specific imaging follow-up is required based
on benign ultrasound characteristics.
2. Otherwise unremarkable renal ultrasound.

## 2022-12-15 ENCOUNTER — Ambulatory Visit: Payer: Medicare Other | Admitting: Urology

## 2022-12-15 ENCOUNTER — Encounter: Payer: Self-pay | Admitting: Urology

## 2022-12-15 VITALS — BP 156/81 | HR 69 | Ht 72.0 in | Wt 275.0 lb

## 2022-12-15 DIAGNOSIS — Z8546 Personal history of malignant neoplasm of prostate: Secondary | ICD-10-CM

## 2022-12-15 DIAGNOSIS — N393 Stress incontinence (female) (male): Secondary | ICD-10-CM

## 2022-12-15 DIAGNOSIS — N5231 Erectile dysfunction following radical prostatectomy: Secondary | ICD-10-CM

## 2022-12-15 DIAGNOSIS — R399 Unspecified symptoms and signs involving the genitourinary system: Secondary | ICD-10-CM | POA: Diagnosis not present

## 2022-12-15 NOTE — Progress Notes (Signed)
12/15/2022 3:18 PM   Chad Norman [birth year] [id number]  Referring provider: Latanya Maudlin, NP [place],  [place] [id number]  Chief Complaint  Patient presents with   Other    HPI: Chad Norman is a 72 y.o. male presents to establish urologic care with a recent retirement of his previous urologist.  Has been followed for a history of prostate cancer, post prostatectomy incontinence, overactive bladder and erectile dysfunction  History of prostate cancer status post radical prostatectomy in [place] in 2018 Had post prostatectomy incontinence which did not respond to pelvic floor PT.  Macroplastique injection 06/2021 with some improvement Has been on intracavernosal injection therapy for ED but has only injected on 1 occasion and is not comfortable performing the injections Last PSA 01/18/2022 was <0.1 Last visit with Dr. Yves Dill August 2023 Currently on Gemtesa and notes some improvement in his storage related voiding symptoms Stress incontinence usually occurs with cough.  He does feel his stress incontinence has improved   PMH: Past Medical History:  Diagnosis Date   Aortic atherosclerosis ([facility])    B12 deficiency    HLD (hyperlipidemia)    Hypertension    Hypertensive heart disease with chronic diastolic congestive heart failure ([facility])    Lymphocytic colitis 2021   OSA on CPAP    Osteoarthritis    Osteopenia    Prediabetes    Prostate cancer ([facility])    PVD (peripheral vascular disease) ([place])    Renal cyst, left     Surgical History: Past Surgical History:  Procedure Laterality Date   COLONOSCOPY N/A 09/07/2020   Procedure: COLONOSCOPY;  Surgeon: [name], MD;  Location: [facility] ENDOSCOPY;  Service: Endoscopy;  Laterality: N/A;   CYSTOSCOPY MACROPLASTIQUE IMPLANT N/A 07/15/2021   Procedure: CYSTOSCOPY MACROPLASTIQUE IMPLANT;  Surgeon: [name], MD;  Location: [facility] ORS;  Service: Urology;  Laterality: N/A;   KNEE ARTHROSCOPY  Right 1999   PROSTATE SURGERY N/A 2683   RALP   UMBILICAL HERNIA REPAIR N/A 04/2019    Home Medications:  Allergies as of 12/15/2022       Reactions   Amoxil [amoxicillin] Diarrhea        Medication List        Accurate as of December 15, 2022  3:18 PM. If you have any questions, ask your nurse or doctor.          STOP taking these medications    ciprofloxacin 500 MG tablet Commonly known as: CIPRO Stopped by: Abbie Sons, MD       TAKE these medications    AIRBORNE ELDERBERRY PO Take 1 tablet by mouth daily.   amLODipine 2.5 MG tablet Commonly known as: NORVASC Take 2.5 mg by mouth daily.   aspirin EC 81 MG tablet Take 81 mg by mouth daily. Swallow whole.   Biotin 10000 MCG Tabs Take 10,000 mcg by mouth daily.   carvedilol 12.5 MG tablet Commonly known as: COREG Take 12.5 mg by mouth 2 (two) times daily with a meal.   chlorthalidone 25 MG tablet Commonly known as: HYGROTON Take 25 mg by mouth daily as needed (swelling).   cyanocobalamin 1000 MCG tablet Commonly known as: VITAMIN B12 Take 1,000 mcg by mouth daily.   loratadine 10 MG tablet Commonly known as: CLARITIN Take 10 mg by mouth daily.   solifenacin 5 MG tablet Commonly known as: VESICARE Take 5 mg by mouth daily.   Symbicort 160-4.5 MCG/ACT inhaler Generic drug: budesonide-formoterol Inhale into the lungs.  telmisartan 80 MG tablet Commonly known as: MICARDIS Take 80 mg by mouth daily.   Uribel 118 MG Caps Take 1 capsule (118 mg total) by mouth every 6 (six) hours as needed (dysuria).   Vitamin D 50 MCG (2000 UT) tablet Take 2,000 Units by mouth daily.        Allergies:  Allergies  Allergen Reactions   Amoxil [Amoxicillin] Diarrhea    Family History: No family history on file.  Social History:  reports that he has never smoked. He has never used smokeless tobacco. He reports current alcohol use of about 3.0 standard drinks of alcohol per week. No history on  file for drug use.   Physical Exam: BP (!) 156/81   Pulse 69   Ht 6' (1.829 m)   Wt 275 lb (124.7 kg)   BMI 37.30 kg/m   Constitutional:  Alert and oriented, No acute distress. HEENT: [place] AT Respiratory: Normal respiratory effort, no increased work of breathing.   Assessment & Plan:    1.  History prostate cancer PSA drawn today Continue annual follow-up  2.  Overactive bladder Stable on Gemtesa  3.  Stress urinary incontinence Artificial urinary sphincter was discussed and he is not interested.  He does feel his stress incontinence is improving  4.  Erectile dysfunction Not comfortable with intracavernosal injections.  He is not interested in penile implant surgery   Abbie Sons, MD  [facility] [address], [place], [place] [address] [phone]

## 2022-12-16 ENCOUNTER — Encounter: Payer: Self-pay | Admitting: Family Medicine

## 2022-12-16 ENCOUNTER — Encounter: Payer: Self-pay | Admitting: Urology

## 2022-12-16 LAB — PSA: Prostate Specific Ag, Serum: <0.1 ng/mL (ref 0.0–4.0)

## 2023-01-02 ENCOUNTER — Ambulatory Visit (INDEPENDENT_AMBULATORY_CARE_PROVIDER_SITE_OTHER): Payer: Medicare Other | Admitting: Internal Medicine

## 2023-01-02 VITALS — BP 174/83 | HR 65 | Resp 16 | Ht 72.0 in | Wt 277.0 lb

## 2023-01-02 DIAGNOSIS — I110 Hypertensive heart disease with heart failure: Secondary | ICD-10-CM | POA: Diagnosis not present

## 2023-01-02 DIAGNOSIS — G4733 Obstructive sleep apnea (adult) (pediatric): Secondary | ICD-10-CM | POA: Diagnosis not present

## 2023-01-02 DIAGNOSIS — E669 Obesity, unspecified: Secondary | ICD-10-CM

## 2023-01-02 DIAGNOSIS — Z7189 Other specified counseling: Secondary | ICD-10-CM | POA: Diagnosis not present

## 2023-01-02 DIAGNOSIS — I5032 Chronic diastolic (congestive) heart failure: Secondary | ICD-10-CM

## 2023-01-02 NOTE — Patient Instructions (Signed)

## 2023-01-02 NOTE — Progress Notes (Signed)
Sleep Medicine   Office Visit  Patient Name: Chad Norman DOB: May 09, 1951 MRN [id number]    Chief Complaint: establish care for OSA on CPAP  Brief History:  Chad Norman presents for an initial consult for sleep evaluation and to establish care. The patient has a 20 year history of sleep apnea and is currently on a CPAP at 9cm. Sleep quality is good. This is noted most nights. Prior to using a PAP, the patient's bed partner/ family reported the following symptoms:  Daytime sleepiness at night. The patient relates the following symptoms currently: Needs a new PAP machine.  No complaints with the therapy The patient goes to sleep at 10p and wakes up at [place]. The patient denies a history of psychiatric problems. The Epworth Sleepiness Score is 8 out of 24 . The patient's STOP-BANG score is 5. The patient relates  Cardiovascular risk factors include: atherosclerosis, angina, hypertension, diastolic congestive heart failureTiger. The patient is currently on a PAP@ 9 cmH2O. The patient reports using her/his CPAP and feels rested after sleeping with PAP.  The patient reports benefiting from PAP use and would like for her/him to continue using PAP. Reported sleepiness is  improved. The compliance download shows  99% compliance with an average use time of 8 hours 52  minutes. The AHI is not measured with his current machine.  The patient continues to require PAP therapy as a medical necessity in order to eliminate his/her sleep apnea.     ROS  General: (-) fever, (-) chills, (-) night sweat Nose and Sinuses: (-) nasal stuffiness or itchiness, (-) postnasal drip, (-) nosebleeds, (-) sinus trouble. Mouth and Throat: (-) sore throat, (-) hoarseness. Neck: (-) swollen glands, (-) enlarged thyroid, (-) neck pain. Respiratory: + cough, + shortness of breath, + wheezing. Neurologic: - numbness, - tingling. Psychiatric: - anxiety, - depression Sleep behavior: -sleep paralysis -hypnogogic hallucinations -dream  enactment      -vivid dreams -cataplexy -night terrors -sleep walking   Current Medication: Outpatient Encounter Medications as of 01/02/2023  Medication Sig   amLODipine (NORVASC) 2.5 MG tablet Take 1 tablet by mouth daily.   carvedilol (COREG) 12.5 MG tablet Take 1 tablet by mouth 2 (two) times daily with a meal.   chlorthalidone (HYGROTON) 25 MG tablet Take 1 tablet by mouth daily as needed.   montelukast (SINGULAIR) 10 MG tablet Take 1 tablet by mouth at bedtime.   mupirocin ointment (BACTROBAN) 2 % Apply topically daily.   telmisartan (MICARDIS) 80 MG tablet Take 1 tablet by mouth daily.   aspirin EC 81 MG tablet Take 81 mg by mouth daily. Swallow whole.   Biotin 10000 MCG TABS Take 10,000 mcg by mouth daily.   Cholecalciferol (VITAMIN D) 50 MCG (2000 UT) tablet Take 2,000 Units by mouth daily.   GEMTESA 75 MG TABS Take 1 tablet by mouth daily.   loratadine (CLARITIN) 10 MG tablet Take 10 mg by mouth daily.   Meth-Hyo-M Bl-Na Phos-Ph Sal (URIBEL) 118 MG CAPS Take 1 capsule (118 mg total) by mouth every 6 (six) hours as needed (dysuria).   Misc Natural Products (AIRBORNE ELDERBERRY PO) Take 1 tablet by mouth daily.   solifenacin (VESICARE) 5 MG tablet Take 5 mg by mouth daily.   SYMBICORT 160-4.5 MCG/ACT inhaler Inhale into the lungs.   terbinafine (LAMISIL) 250 MG tablet Take 250 mg by mouth daily.   vitamin B-12 (CYANOCOBALAMIN) 1000 MCG tablet Take 1,000 mcg by mouth daily.   [DISCONTINUED] amLODipine (NORVASC) 2.5 MG tablet Take 2.5  mg by mouth daily.   [DISCONTINUED] carvedilol (COREG) 12.5 MG tablet Take 12.5 mg by mouth 2 (two) times daily with a meal.   [DISCONTINUED] chlorthalidone (HYGROTON) 25 MG tablet Take 25 mg by mouth daily as needed (swelling).   [DISCONTINUED] telmisartan (MICARDIS) 80 MG tablet Take 80 mg by mouth daily.   No facility-administered encounter medications on file as of 01/02/2023.    Surgical History: Past Surgical History:  Procedure Laterality  Date   COLONOSCOPY N/A 09/07/2020   Procedure: COLONOSCOPY;  Surgeon: [name], MD;  Location: [facility] ENDOSCOPY;  Service: Endoscopy;  Laterality: N/A;   CYSTOSCOPY MACROPLASTIQUE IMPLANT N/A 07/15/2021   Procedure: CYSTOSCOPY MACROPLASTIQUE IMPLANT;  Surgeon: [name], MD;  Location: [facility] ORS;  Service: Urology;  Laterality: N/A;   KNEE ARTHROSCOPY Right 1999   PROSTATE SURGERY N/A [id number]   RALP   UMBILICAL HERNIA REPAIR N/A 04/2019    Medical History: Past Medical History:  Diagnosis Date   Aortic atherosclerosis (HCC)    B12 deficiency    HLD (hyperlipidemia)    Hypertension    Hypertensive heart disease with chronic diastolic congestive heart failure ([place])    Lymphocytic colitis 2021   OSA on CPAP    Osteoarthritis    Osteopenia    Prediabetes    Prostate cancer (HCC)    PVD (peripheral vascular disease) (HCC)    Renal cyst, left     Family History: Non contributory to the present illness  Social History: Social History   Socioeconomic History   Marital status: Married    Spouse name: Not on file   Number of children: Not on file   Years of education: Not on file   Highest education level: Not on file  Occupational History   Not on file  Tobacco Use   Smoking status: Former    Types: Cigarettes   Smokeless tobacco: Never  Vaping Use   Vaping Use: Never used  Substance and Sexual Activity   Alcohol use: Yes    Alcohol/week: 3.0 standard drinks of alcohol    Types: 3 Cans of beer per week   Drug use: Not on file   Sexual activity: Not on file  Other Topics Concern   Not on file  Social History Narrative   Live with wife.   Social Determinants of Health   Financial Resource Strain: Not on file  Food Insecurity: Not on file  Transportation Needs: Not on file  Physical Activity: Not on file  Stress: Not on file  Social Connections: Not on file  Intimate Partner Violence: Not on file    Vital Signs: Blood pressure (!) 174/83, pulse  65, resp. rate 16, height 6' (1.829 m), weight 277 lb (125.6 kg), SpO2 95 %. Body mass index is 37.57 kg/m.   Examination: General Appearance: The patient is well-developed, well-nourished, and in no distress. Neck Circumference: 44 cm Skin: Gross inspection of skin unremarkable. Head: normocephalic, no gross deformities. Eyes: no gross deformities noted. ENT: ears appear grossly normal Neurologic: Alert and oriented. No involuntary movements.    STOP BANG RISK ASSESSMENT S (snore) Have you been told that you snore?     NO   T (tired) Are you often tired, fatigued, or sleepy during the day?   NO  O (obstruction) Do you stop breathing, choke, or gasp during sleep? NO   P (pressure) Do you have or are you being treated for high blood pressure? YES   B (BMI) Is your body index  greater than 35 kg/m? YES   A (age) Are you 74 years old or older? YES   N (neck) Do you have a neck circumference greater than 16 inches?   YES   G (gender) Are you a male? YES   TOTAL STOP/BANG "YES" ANSWERS 5                                                               A STOP-Bang score of 2 or less is considered low risk, and a score of 5 or more is high risk for having either moderate or severe OSA. For people who score 3 or 4, doctors may need to perform further assessment to determine how likely they are to have OSA.         EPWORTH SLEEPINESS SCALE:  Scale:  (0)= no chance of dozing; (1)= slight chance of dozing; (2)= moderate chance of dozing; (3)= high chance of dozing  Chance  Situtation    Sitting and reading: 1    Watching TV: 2    Sitting Inactive in public: 0    As a passenger in car: 1      Lying down to rest: 2    Sitting and talking: 0    Sitting quielty after lunch: 1    In a car, stopped in traffic: 1   TOTAL SCORE:   8 out of 24   CPAP COMPLIANCE DATA:  Date Range: 01/02/22 - 01/01/23  Average Daily Use: 8 hours 52 minutes  Median Use: 9 hours 5  minutes  Compliance for > 4 Hours: 99 %  AHI: Not recorded respiratory events per hour  Days Used: 360/365 days  Mask Leak: Not recorded  95th Percentile Pressure: 9 cmh20   SLEEP STUDIES:  PSG (08/24/11) AHI 21.2, SUPINE AHI 57.4, MIN SPO2 87%   LABS: Recent Results (from the past 2160 hour(s))  PSA     Status: None   Collection Time: 12/15/22  3:42 PM  Result Value Ref Range   Prostate Specific Ag, Serum <0.1 0.0 - 4.0 ng/mL    Comment: Roche ECLIA methodology. According to the American Urological Association, Serum PSA should decrease and remain at undetectable levels after radical prostatectomy. The AUA defines biochemical recurrence as an initial PSA value 0.2 ng/mL or greater followed by a subsequent confirmatory PSA value 0.2 ng/mL or greater. Values obtained with different assay methods or kits cannot be used interchangeably. Results cannot be interpreted as absolute evidence of the presence or absence of malignant disease.     Radiology: US RENAL  Result Date: 03/02/2022 CLINICAL DATA:  Left renal abnormality on prior CT EXAM: RENAL / URINARY TRACT ULTRASOUND COMPLETE COMPARISON:  02/11/2022 FINDINGS: Right Kidney: Renal measurements: 12.9 x 6.2 x 6.1 cm = volume: 257.3 mL. Echogenicity within normal limits. No mass or hydronephrosis visualized. Left Kidney: Renal measurements: 13.2 x 6.5 x 6.3 cm = volume: 281.5 mL. Echogenicity within normal limits. Large exophytic simple cyst measuring 16.2 x 17.4 x 14.1 cm corresponds to the abnormality seen on prior CT. No internal septations, debris, or wall thickening. No hydronephrosis. Bladder: Appears normal for degree of bladder distention. Other: None. IMPRESSION: 1. Exophytic 17.4 cm a simple left renal cyst, corresponding to prior CT findings. No specific imaging follow-up is required  based on benign ultrasound characteristics. 2. Otherwise unremarkable renal ultrasound. Electronically Signed   By: Randa Ngo M.D.    On: 03/02/2022 07:29    No results found.  No results found.    Assessment and Plan: Patient Active Problem List   Diagnosis Date Noted   Obesity (BMI 30-39.9) 01/02/2023   CPAP use counseling 01/02/2023   Chronic bronchitis ([place]) 02/16/2022   Urge incontinence of urine 01/31/2022   Chronic cough 06/18/2021   Prediabetes 06/19/2020   Vitamin B12 deficiency 06/19/2020   Atherosclerosis of native coronary artery of native heart with angina pectoris ([place]) 06/18/2020   Fatty infiltration of liver 06/18/2020   Hypertensive heart disease with chronic diastolic congestive heart failure ([place]) 06/18/2020   OSA on CPAP 06/18/2020   Osteopenia of multiple sites 06/18/2020   PVD (peripheral vascular disease) ([place]) 06/18/2020   Renal cyst, left 06/18/2020   H/O prostate cancer 06/17/2020   Hyperlipidemia, mixed 06/04/2020   1. OSA on CPAP PLAN OSA:   Patient evaluation reveals presence of OSA with a baseline AHI of 21.2 on PSG of 2012. He has been using a cpap at 9 cm with good control of symptoms. We have no AHI data on the machine. His machine is past end of usable life and must be replaced. Plan- replace machine, f/u 30d after set up.   2. CPAP use counseling CPAP Counseling: had a lengthy discussion with the patient regarding the importance of PAP therapy in management of the sleep apnea. Patient appears to understand the risk factor reduction and also understands the risks associated with untreated sleep apnea. Patient will try to make a good faith effort to remain compliant with therapy. Also instructed the patient on proper cleaning of the device including the water must be changed daily if possible and use of distilled water is preferred. Patient understands that the machine should be regularly cleaned with appropriate recommended cleaning solutions that do not damage the PAP machine for example given white vinegar and water rinses. Other methods such as ozone treatment may not be as  good as these simple methods to achieve cleaning.   3. Hypertensive heart disease with chronic diastolic congestive heart failure (HCC) Stable, follow with cardiology.   4. Obesity (BMI 30-39.9) Obesity Counseling: Had a lengthy discussion regarding patients BMI and weight issues. Patient was instructed on portion control as well as increased activity. Also discussed caloric restrictions with trying to maintain intake less than 2000 Kcal. Discussions were made in accordance with the 5As of weight management. Simple actions such as not eating late and if able to, taking a walk is suggested.     General Counseling: I have discussed the findings of the evaluation and examination with Legrand Como.  I have also discussed any further diagnostic evaluation thatmay be needed or ordered today. Tydarius verbalizes understanding of the findings of todays visit. We also reviewed his medications today and discussed drug interactions and side effects including but not limited excessive drowsiness and altered mental states. We also discussed that there is always a risk not just to him but also people around him. he has been encouraged to call the office with any questions or concerns that should arise related to todays visit.  No orders of the defined types were placed in this encounter.       I have personally obtained a history, evaluated the patient, evaluated pertinent data, formulated the assessment and plan and placed orders.   This patient was seen today by Judson Roch  Enid Derry, PA-C in collaboration with Dr. Devona Konig.   Allyne Gee, MD [facility] Diplomate ABMS Pulmonary and Critical Care Medicine Sleep medicine

## 2023-12-12 ENCOUNTER — Other Ambulatory Visit: Payer: Medicare Other

## 2023-12-12 DIAGNOSIS — Z8546 Personal history of malignant neoplasm of prostate: Secondary | ICD-10-CM

## 2023-12-13 LAB — PSA: Prostate Specific Ag, Serum: <0.1 ng/mL (ref 0.0–4.0)

## 2023-12-14 ENCOUNTER — Encounter: Payer: Self-pay | Admitting: Urology

## 2023-12-14 ENCOUNTER — Ambulatory Visit: Payer: Medicare Other | Admitting: Urology

## 2023-12-14 VITALS — BP 159/77 | HR 63

## 2023-12-14 DIAGNOSIS — N9989 Other postprocedural complications and disorders of genitourinary system: Secondary | ICD-10-CM

## 2023-12-14 DIAGNOSIS — N529 Male erectile dysfunction, unspecified: Secondary | ICD-10-CM | POA: Diagnosis not present

## 2023-12-14 DIAGNOSIS — Z8546 Personal history of malignant neoplasm of prostate: Secondary | ICD-10-CM | POA: Diagnosis not present

## 2023-12-14 DIAGNOSIS — R32 Unspecified urinary incontinence: Secondary | ICD-10-CM

## 2023-12-14 NOTE — Progress Notes (Signed)
I, Maysun Anabel Bene, acting as a scribe for Riki Altes, MD., have documented all relevant documentation on the behalf of Riki Altes, MD, as directed by Riki Altes, MD while in the presence of Riki Altes, MD.  12/14/2023 2:03 PM   Chad Norman [birth year] [id number]  Referring provider: Luciana Axe, NP [address] [place],  [place] [address]  Chief Complaint  Patient presents with   Follow-up   Urologic history: 1. History of prostate cancer Radical prostatectomy 2018 in [place]. Post prostatectomy incontinence, treated with pelvic floor physical therapy, and periurethral bulking in 2022 with minimal improvement. Post prostatectomy ED.  PDE5 inhibitor refractory. Has done intracavernosal injections but was not comfortable performing the injections.  HPI: Chad Norman is a 73 y.o. male presents for annual follow-up.   No significant changes since last year.  He was taking Gemtesa but did not see any improvement in his symptoms.  PSA 12/12/2023 remains undetectable at <0.1  PSA trend   Prostate Specific Ag, Serum  Latest Ref Rng 0.0 - 4.0 ng/mL  12/15/2022 <0.1   12/12/2023 <0.1      PMH: Past Medical History:  Diagnosis Date   Aortic atherosclerosis (HCC)    B12 deficiency    HLD (hyperlipidemia)    Hypertension    Hypertensive heart disease with chronic diastolic congestive heart failure (HCC)    Lymphocytic colitis 2021   OSA on CPAP    Osteoarthritis    Osteopenia    Prediabetes    Prostate cancer (HCC)    PVD (peripheral vascular disease) (HCC)    Renal cyst, left     Surgical History: Past Surgical History:  Procedure Laterality Date   COLONOSCOPY N/A 09/07/2020   Procedure: COLONOSCOPY;  Surgeon: Regis Bill, MD;  Location: [facility] ENDOSCOPY;  Service: Endoscopy;  Laterality: N/A;   CYSTOSCOPY MACROPLASTIQUE IMPLANT N/A 07/15/2021   Procedure: CYSTOSCOPY MACROPLASTIQUE IMPLANT;  Surgeon: Orson Ape, MD;   Location: [facility] ORS;  Service: Urology;  Laterality: N/A;   KNEE ARTHROSCOPY Right 1999   PROSTATE SURGERY N/A 2018   RALP   UMBILICAL HERNIA REPAIR N/A 04/2019    Home Medications:  Allergies as of 12/14/2023   No Known Allergies      Medication List        Accurate as of December 14, 2023  2:03 PM. If you have any questions, ask your nurse or doctor.          STOP taking these medications    Gemtesa 75 MG Tabs Generic drug: Vibegron Stopped by: Riki Altes   mupirocin ointment 2 % Commonly known as: BACTROBAN Stopped by: Riki Altes   terbinafine 250 MG tablet Commonly known as: LAMISIL Stopped by: Riki Altes   Uribel 118 MG Caps Stopped by: Riki Altes       TAKE these medications    AIRBORNE ELDERBERRY PO Take 1 tablet by mouth daily.   amLODipine 2.5 MG tablet Commonly known as: NORVASC Take 1 tablet by mouth daily.   aspirin EC 81 MG tablet Take 81 mg by mouth daily. Swallow whole.   Biotin 52841 MCG Tabs Take 10,000 mcg by mouth daily.   carvedilol 12.5 MG tablet Commonly known as: COREG Take 1 tablet by mouth 2 (two) times daily with a meal.   chlorthalidone 25 MG tablet Commonly known as: HYGROTON Take 1 tablet by mouth daily as needed.   cyanocobalamin 1000 MCG tablet Commonly known  as: VITAMIN B12 Take 1,000 mcg by mouth daily.   loratadine 10 MG tablet Commonly known as: CLARITIN Take 10 mg by mouth daily.   montelukast 10 MG tablet Commonly known as: SINGULAIR Take 1 tablet by mouth at bedtime.   solifenacin 5 MG tablet Commonly known as: VESICARE Take 5 mg by mouth daily.   Symbicort 160-4.5 MCG/ACT inhaler Generic drug: budesonide-formoterol Inhale into the lungs.   telmisartan 80 MG tablet Commonly known as: MICARDIS Take 1 tablet by mouth daily.   Vitamin D 50 MCG (2000 UT) tablet Take 2,000 Units by mouth daily.        Allergies: No Known Allergies  Social History:  reports that he has  quit smoking. His smoking use included cigarettes. He has never used smokeless tobacco. He reports current alcohol use of about 3.0 standard drinks of alcohol per week. No history on file for drug use.   Physical Exam: BP (!) 159/77   Pulse 63   Constitutional:  Alert and oriented, No acute distress. HEENT: [place] AT Respiratory: Normal respiratory effort, no increased work of breathing. Psychiatric: Normal mood and affect.   Assessment & Plan:    1. History of prostate cancer PSA remains undetectable  2. Post-prostatectomy incontinence He has not been interested in an artificial sphincter and was provided literature to review. If he desires further evaluation, we'll schedule an appointment with Dr. Sherron Monday.   3. Erectile dysfunction  Does not desire intracavernosal intercovernosal injections or penile implant surgery.   Continue annual follow-up.  [facility] [address] [place], [place] [address] [phone]

## 2024-04-15 ENCOUNTER — Encounter: Payer: Self-pay | Admitting: Urology

## 2024-04-16 ENCOUNTER — Other Ambulatory Visit: Payer: Self-pay | Admitting: *Deleted

## 2024-04-16 MED ORDER — SOLIFENACIN SUCCINATE 5 MG PO TABS: 5.0000 mg | ORAL_TABLET | Freq: Every day | ORAL | 2 refills | Status: DC

## 2024-06-09 ENCOUNTER — Encounter: Payer: Self-pay | Admitting: Emergency Medicine

## 2024-06-09 ENCOUNTER — Ambulatory Visit: Payer: Self-pay | Admitting: Emergency Medicine

## 2024-06-09 ENCOUNTER — Ambulatory Visit
Admission: EM | Admit: 2024-06-09 | Discharge: 2024-06-09 | Disposition: A | Discharge status: Home / Self Care | Attending: Emergency Medicine | Admitting: Emergency Medicine

## 2024-06-09 DIAGNOSIS — N12 Tubulo-interstitial nephritis, not specified as acute or chronic: Secondary | ICD-10-CM | POA: Insufficient documentation

## 2024-06-09 LAB — BASIC METABOLIC PANEL WITH GFR
Anion gap: 9 (ref 5–15)
BUN: 15 mg/dL (ref 8–23)
CO2: 27 mmol/L (ref 22–32)
Calcium: 8.3 mg/dL — ABNORMAL LOW (ref 8.9–10.3)
Chloride: 99 mmol/L (ref 98–111)
Creatinine, Ser: 0.83 mg/dL (ref 0.61–1.24)
GFR, Estimated: >60 mL/min (ref >60)
Glucose, Bld: 128 mg/dL — ABNORMAL HIGH (ref 70–99)
Potassium: 3.5 mmol/L (ref 3.5–5.1)
Sodium: 135 mmol/L (ref 135–145)

## 2024-06-09 LAB — CBC WITH DIFFERENTIAL/PLATELET
Abs Immature Granulocytes: 0.02 K/uL (ref 0.00–0.07)
Basophils Absolute: 0 K/uL (ref 0.0–0.1)
Basophils Relative: 1 %
Eosinophils Absolute: 0.2 K/uL (ref 0.0–0.5)
Eosinophils Relative: 3 %
HCT: 43.4 % (ref 39.0–52.0)
Hemoglobin: 14.5 g/dL (ref 13.0–17.0)
Immature Granulocytes: 0 %
Lymphocytes Relative: 12 %
Lymphs Abs: 1 K/uL (ref 0.7–4.0)
MCH: 32.5 pg (ref 26.0–34.0)
MCHC: 33.4 g/dL (ref 30.0–36.0)
MCV: 97.3 fL (ref 80.0–100.0)
Monocytes Absolute: 1.1 K/uL — ABNORMAL HIGH (ref 0.1–1.0)
Monocytes Relative: 14 %
Neutro Abs: 5.8 K/uL (ref 1.7–7.7)
Neutrophils Relative %: 70 %
Platelets: 144 K/uL — ABNORMAL LOW (ref 150–400)
RBC: 4.46 MIL/uL (ref 4.22–5.81)
RDW: 13.2 % (ref 11.5–15.5)
WBC: 8.1 K/uL (ref 4.0–10.5)
nRBC: 0 % (ref 0.0–0.2)

## 2024-06-09 LAB — URINALYSIS, W/ REFLEX TO CULTURE (INFECTION SUSPECTED)
Bilirubin Urine: NEGATIVE
Glucose, UA: NEGATIVE mg/dL
Ketones, ur: NEGATIVE mg/dL
Nitrite: POSITIVE — AB
Protein, ur: 30 mg/dL — AB
Specific Gravity, Urine: 1.01 (ref 1.005–1.030)
WBC, UA: >50 WBC/hpf (ref 0–5)
pH: 5.5 (ref 5.0–8.0)

## 2024-06-09 MED ORDER — PHENAZOPYRIDINE HCL 200 MG PO TABS
200.0000 mg | ORAL_TABLET | Freq: Three times a day (TID) | ORAL | 0 refills | Status: AC | PRN
Start: 2024-06-09 — End: ?

## 2024-06-09 MED ORDER — CEFDINIR 300 MG PO CAPS: 300.0000 mg | ORAL_CAPSULE | Freq: Two times a day (BID) | ORAL | 0 refills | Status: AC

## 2024-06-09 MED ORDER — CEFTRIAXONE SODIUM 1 G IJ SOLR
1.0000 g | Freq: Once | INTRAMUSCULAR | Status: AC
  Administered 2024-06-09: 1 g via INTRAMUSCULAR

## 2024-06-09 NOTE — ED Provider Notes (Signed)
 HPI  SUBJECTIVE:  Chad Norman is a 73 y.o. male who presents with 4 to 5 days of dysuria, urgency, frequency, fevers Tmax 103.5.  He states he feels as if he is able to completely empty his bladder, but is urinating small amounts at a time.  No cloudy or odorous urine, hematuria, nausea, vomiting, abdominal, back, flank pain.  No pelvic, perineal pain.  No perineal swelling.  No body aches.  No antibiotics in the past month.  He took Tylenol 500 mg within the past 6 hours with fever reduction.  He has also been taking Cystex with symptom relief.  No aggravating factors. He has a past medical history of UTI, nonobstructing nephrolithiasis, hypertension, chronic CHF, prostate cancer status post radical prostatectomy, PVD, left renal cyst.  No history of diabetes, chronic kidney disease.  [facility]  Urology: [facility].  Past Medical History:  Diagnosis Date   Aortic atherosclerosis (HCC)    B12 deficiency    HLD (hyperlipidemia)    Hypertension    Hypertensive heart disease with chronic diastolic congestive heart failure (HCC)    Lymphocytic colitis 2021   OSA on CPAP    Osteoarthritis    Osteopenia    Prediabetes    Prostate cancer (HCC)    PVD (peripheral vascular disease) (HCC)    Renal cyst, left     Past Surgical History:  Procedure Laterality Date   COLONOSCOPY N/A 09/07/2020   Procedure: COLONOSCOPY;  Surgeon: Maryruth Ole DASEN, MD;  Location: [facility] ENDOSCOPY;  Service: Endoscopy;  Laterality: N/A;   CYSTOSCOPY MACROPLASTIQUE IMPLANT N/A 07/15/2021   Procedure: CYSTOSCOPY MACROPLASTIQUE IMPLANT;  Surgeon: Kassie Ozell SAUNDERS, MD;  Location: [facility] ORS;  Service: Urology;  Laterality: N/A;   KNEE ARTHROSCOPY Right 1999   PROSTATE SURGERY N/A 2018   RALP   UMBILICAL HERNIA REPAIR N/A 04/2019    History reviewed. No pertinent family history.  Social History   Tobacco Use   Smoking status: Former    Types: Cigarettes   Smokeless tobacco: Never   Vaping Use   Vaping status: Never Used  Substance Use Topics   Alcohol use: Yes    Alcohol/week: 3.0 standard drinks of alcohol    Types: 3 Cans of beer per week   Drug use: Never    No current facility-administered medications for this encounter.  Current Outpatient Medications:    cefdinir  (OMNICEF ) 300 MG capsule, Take 1 capsule (300 mg total) by mouth 2 (two) times daily., Disp: 20 capsule, Rfl: 0   phenazopyridine  (PYRIDIUM ) 200 MG tablet, Take 1 tablet (200 mg total) by mouth 3 (three) times daily as needed for pain., Disp: 6 tablet, Rfl: 0   amLODipine (NORVASC) 2.5 MG tablet, Take 1 tablet by mouth daily., Disp: , Rfl:    aspirin EC 81 MG tablet, Take 81 mg by mouth daily. Swallow whole., Disp: , Rfl:    Biotin 89999 MCG TABS, Take 10,000 mcg by mouth daily., Disp: , Rfl:    carvedilol (COREG) 12.5 MG tablet, Take 1 tablet by mouth 2 (two) times daily with a meal., Disp: , Rfl:    chlorthalidone (HYGROTON) 25 MG tablet, Take 1 tablet by mouth daily as needed., Disp: , Rfl:    Cholecalciferol (VITAMIN D) 50 MCG (2000 UT) tablet, Take 2,000 Units by mouth daily., Disp: , Rfl:    loratadine (CLARITIN) 10 MG tablet, Take 10 mg by mouth daily., Disp: , Rfl:    Misc Natural Products (AIRBORNE ELDERBERRY PO), Take 1  tablet by mouth daily., Disp: , Rfl:    montelukast (SINGULAIR) 10 MG tablet, Take 1 tablet by mouth at bedtime., Disp: , Rfl:    solifenacin  (VESICARE ) 5 MG tablet, Take 5 mg by mouth daily., Disp: , Rfl:    solifenacin  (VESICARE ) 5 MG tablet, Take 1 tablet (5 mg total) by mouth daily., Disp: 90 tablet, Rfl: 2   SYMBICORT 160-4.5 MCG/ACT inhaler, Inhale into the lungs., Disp: , Rfl:    telmisartan (MICARDIS) 80 MG tablet, Take 1 tablet by mouth daily., Disp: , Rfl:    vitamin B-12 (CYANOCOBALAMIN) 1000 MCG tablet, Take 1,000 mcg by mouth daily., Disp: , Rfl:   No Known Allergies   ROS  As noted in HPI.   Physical Exam  BP 120/70 (BP Location: Right Arm)   Pulse  63   Temp 99.4 F (37.4 C) (Oral)   Resp 16   Ht 6' 1 (1.854 m)   Wt 125.6 kg   SpO2 93%   BMI 36.53 kg/m   Constitutional: Well developed, well nourished, no acute distress Eyes:  EOMI, conjunctiva normal bilaterally HENT: Normocephalic, atraumatic,mucus membranes moist Respiratory: Normal inspiratory effort Cardiovascular: Normal rate GI: nondistended soft, nontender.  No suprapubic, flank tenderness.  Active bowel sounds.  No rebound, guarding. Back: No CVAT Rectal: Prostate absent.  Chaperone present during exam. skin: No rash, skin intact Musculoskeletal: no deformities Neurologic: Alert & oriented x 3, no focal neuro deficits Psychiatric: Speech and behavior appropriate   ED Course   Medications  cefTRIAXone  (ROCEPHIN ) injection 1 g (1 g Intramuscular Given 06/09/24 1435)    Orders Placed This Encounter  Procedures   Urine Culture    Standing Status:   Standing    Number of Occurrences:   1   Urinalysis, w/ Reflex to Culture (Infection Suspected) -Urine, Clean Catch    Standing Status:   Standing    Number of Occurrences:   1    Specimen Source:   Urine, Clean Catch [76]   CBC with Differential    Standing Status:   Standing    Number of Occurrences:   1   Basic metabolic panel    Standing Status:   Standing    Number of Occurrences:   1    Results for orders placed or performed during the hospital encounter of 06/09/24 (from the past 24 hours)  Urinalysis, w/ Reflex to Culture (Infection Suspected) -Urine, Clean Catch     Status: Abnormal   Collection Time: 06/09/24 12:57 PM  Result Value Ref Range   Specimen Source URINE, CLEAN CATCH    Color, Urine YELLOW YELLOW   APPearance HAZY (A) CLEAR   Specific Gravity, Urine 1.010 1.005 - 1.030   pH 5.5 5.0 - 8.0   Glucose, UA NEGATIVE NEGATIVE mg/dL   Hgb urine dipstick SMALL (A) NEGATIVE   Bilirubin Urine NEGATIVE NEGATIVE   Ketones, ur NEGATIVE NEGATIVE mg/dL   Protein, ur 30 (A) NEGATIVE mg/dL    Nitrite POSITIVE (A) NEGATIVE   Leukocytes,Ua LARGE (A) NEGATIVE   Squamous Epithelial / HPF 0-5 0 - 5 /HPF   WBC, UA >50 0 - 5 WBC/hpf   RBC / HPF 21-50 0 - 5 RBC/hpf   Bacteria, UA MANY (A) NONE SEEN  CBC with Differential     Status: Abnormal   Collection Time: 06/09/24  2:29 PM  Result Value Ref Range   WBC 8.1 4.0 - 10.5 K/uL   RBC 4.46 4.22 - 5.81 MIL/uL   Hemoglobin  14.5 13.0 - 17.0 g/dL   HCT 56.5 60.9 - 47.9 %   MCV 97.3 80.0 - 100.0 fL   MCH 32.5 26.0 - 34.0 pg   MCHC 33.4 30.0 - 36.0 g/dL   RDW 86.7 88.4 - 84.4 %   Platelets 144 (L) 150 - 400 K/uL   nRBC 0.0 0.0 - 0.2 %   Neutrophils Relative % 70 %   Neutro Abs 5.8 1.7 - 7.7 K/uL   Lymphocytes Relative 12 %   Lymphs Abs 1.0 0.7 - 4.0 K/uL   Monocytes Relative 14 %   Monocytes Absolute 1.1 (H) 0.1 - 1.0 K/uL   Eosinophils Relative 3 %   Eosinophils Absolute 0.2 0.0 - 0.5 K/uL   Basophils Relative 1 %   Basophils Absolute 0.0 0.0 - 0.1 K/uL   Immature Granulocytes 0 %   Abs Immature Granulocytes 0.02 0.00 - 0.07 K/uL  Basic metabolic panel     Status: Abnormal   Collection Time: 06/09/24  2:29 PM  Result Value Ref Range   Sodium 135 135 - 145 mmol/L   Potassium 3.5 3.5 - 5.1 mmol/L   Chloride 99 98 - 111 mmol/L   CO2 27 22 - 32 mmol/L   Glucose, Bld 128 (H) 70 - 99 mg/dL   BUN 15 8 - 23 mg/dL   Creatinine, Ser 9.16 0.61 - 1.24 mg/dL   Calcium 8.3 (L) 8.9 - 10.3 mg/dL   GFR, Estimated >39 >39 mL/min   Anion gap 9 5 - 15   No results found.  ED Clinical Impression  1. Pyelonephritis      ED Assessment/Plan     No previous urine cultures available in [facility] or Care Everywhere  UA consistent with UTI with hematuria, positive nitrites, leukocytes, many bacteria and WBCs.  Sending this off for culture.  Concern for pyelonephritis given fevers of 103.5.  He is afebrile here, but he took Tylenol 500 mg within 6 hours of evaluation.  Will get a CBC, BMP to evaluate for kidney function and leukocytosis, give  1000 mg of Rocephin  here.  Labs reviewed.  CBC, BMP normal.  No leukocytosis.  Will send home with Omnicef  300 mg p.o. twice daily for 10 days for pyelonephritis.  Cannot do Bactrim due to the ARB.  Cephalosporins have been shown to be as effective as fluoroquinolones and Bactrim for treating pyelonephritis per recent study in Annals of Emergency Medicine March 2025.  Also sending home with Pyridium  for symptom relief.  Tylenol 1000 mg 3-4 times a day as needed.  Follow-up with PCP in several days to make sure that he is getting better.  Strict ER return precautions given.  Discussed labs, MDM, treatment plan, and plan for follow-up with patient and family member. Discussed sn/sx that should prompt return to the ED. they agree with plan.   Meds ordered this encounter  Medications   cefTRIAXone  (ROCEPHIN ) injection 1 g   cefdinir  (OMNICEF ) 300 MG capsule    Sig: Take 1 capsule (300 mg total) by mouth 2 (two) times daily.    Dispense:  20 capsule    Refill:  0   phenazopyridine  (PYRIDIUM ) 200 MG tablet    Sig: Take 1 tablet (200 mg total) by mouth 3 (three) times daily as needed for pain.    Dispense:  6 tablet    Refill:  0      *This clinic note was created using Scientist, clinical (histocompatibility and immunogenetics). Therefore, there may be occasional mistakes despite careful proofreading.  ?  Van Knee, MD 06/09/24 [phone]

## 2024-06-09 NOTE — Discharge Instructions (Signed)
 Your CBC and basic metabolic panel were both normal.  I have sent your urine off for culture to make sure that we have you on the correct antibiotic.  I gave you 1000 mg of Rocephin  here and and sending you home with 10 days of Omnicef .  May take 1000 mg of Tylenol 3-4 times a day as needed for pain and fever.  Continue drinking plenty of extra fluids.  Pyridium  will help with your symptoms.  Stop the Cystex.  Go to the emergency department if you still have fevers in 24 hours, you start having back pain, body aches, nausea, vomiting, or for other concerns.

## 2024-06-09 NOTE — ED Triage Notes (Signed)
 Patient c/o dysuria and urinary frequency that started on Wed.  Patient denies any N/V. Patient denies any back or flank pain.

## 2024-06-12 LAB — URINE CULTURE: Culture: 60000 — AB

## 2024-09-01 ENCOUNTER — Other Ambulatory Visit: Payer: Self-pay | Admitting: Urology

## 2024-12-02 ENCOUNTER — Other Ambulatory Visit: Payer: Self-pay

## 2024-12-02 DIAGNOSIS — Z8546 Personal history of malignant neoplasm of prostate: Secondary | ICD-10-CM

## 2024-12-13 ENCOUNTER — Other Ambulatory Visit: Payer: Self-pay

## 2024-12-18 ENCOUNTER — Ambulatory Visit: Payer: Self-pay | Admitting: Urology

## 2024-12-18 ENCOUNTER — Encounter: Payer: Self-pay | Admitting: Urology

## 2024-12-18 VITALS — BP 149/74 | HR 71 | Ht 72.0 in | Wt 280.0 lb

## 2024-12-18 DIAGNOSIS — N9989 Other postprocedural complications and disorders of genitourinary system: Secondary | ICD-10-CM

## 2024-12-18 DIAGNOSIS — R32 Unspecified urinary incontinence: Secondary | ICD-10-CM

## 2024-12-18 DIAGNOSIS — Z8546 Personal history of malignant neoplasm of prostate: Secondary | ICD-10-CM

## 2024-12-18 NOTE — Progress Notes (Signed)
 "  12/18/2024 10:19 AM   Chad Norman [birth year] [id number]  Referring provider: Steva Clotilda DEL, NP [address] [place],  [place] [address]  Chief Complaint  Patient presents with   history of prostate cancer   Urologic history: 1. History of prostate cancer Radical prostatectomy 2018 in [place] . Post prostatectomy incontinence, treated with pelvic floor physical therapy, and periurethral bulking in 2022 with minimal improvement. Post prostatectomy ED.  PDE5 inhibitor refractory. Has done intracavernosal injections but was not comfortable performing the injections.   HPI: Chad Norman is a 74 y.o. male who presents for annual follow-up  No significant changes since last year's visit Annual PSA has not yet been drawn Still with bothersome post-prostatectomy incontinence  PMH: Past Medical History:  Diagnosis Date   Aortic atherosclerosis    B12 deficiency    HLD (hyperlipidemia)    Hypertension    Hypertensive heart disease with chronic diastolic congestive heart failure (HCC)    Lymphocytic colitis 2021   OSA on CPAP    Osteoarthritis    Osteopenia    Prediabetes    Prostate cancer (HCC)    PVD (peripheral vascular disease)    Renal cyst, left     Surgical History: Past Surgical History:  Procedure Laterality Date   COLONOSCOPY N/A 09/07/2020   Procedure: COLONOSCOPY;  Surgeon: Maryruth Ole DASEN, MD;  Location: [facility] ENDOSCOPY;  Service: Endoscopy;  Laterality: N/A;   CYSTOSCOPY MACROPLASTIQUE IMPLANT N/A 07/15/2021   Procedure: CYSTOSCOPY MACROPLASTIQUE IMPLANT;  Surgeon: Kassie Chad SAUNDERS, MD;  Location: [facility] ORS;  Service: Urology;  Laterality: N/A;   KNEE ARTHROSCOPY Right 1999   PROSTATE SURGERY N/A 2018   RALP   UMBILICAL HERNIA REPAIR N/A 04/2019    Home Medications:  Allergies as of 12/18/2024   No Known Allergies      Medication List        Accurate as of December 18, 2024 10:19 AM. If you have any questions, ask your nurse  or doctor.          AIRBORNE ELDERBERRY PO Take 1 tablet by mouth daily.   amLODipine 2.5 MG tablet Commonly known as: NORVASC Take 1 tablet by mouth daily.   aspirin EC 81 MG tablet Take 81 mg by mouth daily. Swallow whole.   atorvastatin 10 MG tablet Commonly known as: LIPITOR Take 10 mg by mouth daily.   Biotin 10000 MCG Tabs Take 10,000 mcg by mouth daily.   carvedilol 12.5 MG tablet Commonly known as: COREG Take 1 tablet by mouth 2 (two) times daily with a meal.   cefdinir  300 MG capsule Commonly known as: OMNICEF  Take 1 capsule (300 mg total) by mouth 2 (two) times daily.   chlorthalidone 25 MG tablet Commonly known as: HYGROTON Take 1 tablet by mouth daily as needed.   clotrimazole-betamethasone cream Commonly known as: LOTRISONE Apply topically 2 (two) times daily.   cyanocobalamin 1000 MCG tablet Commonly known as: VITAMIN B12 Take 1,000 mcg by mouth daily.   empagliflozin 10 MG Tabs tablet Commonly known as: JARDIANCE Take 10 mg by mouth daily.   furosemide 20 MG tablet Commonly known as: LASIX Take 20 mg by mouth.   loratadine 10 MG tablet Commonly known as: CLARITIN Take 10 mg by mouth daily.   montelukast 10 MG tablet Commonly known as: SINGULAIR Take 1 tablet by mouth at bedtime.   nitroGLYCERIN 0.4 MG SL tablet Commonly known as: NITROSTAT Place 0.4 mg under the tongue.   phenazopyridine  200 MG tablet Commonly  known as: PYRIDIUM  Take 1 tablet (200 mg total) by mouth 3 (three) times daily as needed for pain.   solifenacin  5 MG tablet Commonly known as: VESICARE  Take 5 mg by mouth daily.   solifenacin  5 MG tablet Commonly known as: VESICARE  TAKE 1 TABLET BY MOUTH DAILY   Symbicort 160-4.5 MCG/ACT inhaler Generic drug: budesonide-formoterol Inhale into the lungs.   telmisartan 80 MG tablet Commonly known as: MICARDIS Take 1 tablet by mouth daily.   Trelegy Ellipta 100-62.5-25 MCG/ACT Aepb Generic drug:  Fluticasone-Umeclidin-Vilant Inhale 1 puff into the lungs.   Vitamin D 50 MCG (2000 UT) tablet Take 2,000 Units by mouth daily.        Allergies: Allergies[1]  Family History: No family history on file.  Social History:  reports that he has quit smoking. His smoking use included cigarettes. He has never used smokeless tobacco. He reports current alcohol use of about 3.0 standard drinks of alcohol per week. He reports that he does not use drugs.   Physical Exam: BP (!) 152/77   Pulse 79   Ht 6' (1.829 m)   Wt 280 lb (127 kg)   BMI 37.97 kg/m   Constitutional:  Alert, No acute distress. HEENT: [place] AT Respiratory: Normal respiratory effort, no increased work of breathing. Psychiatric: Normal mood and affect.   Assessment & Plan:    1.  History of prostate cancer PSA drawn today Continue annual follow-up  2.  Post-prostatectomy incontinence Prior bulking procedure Will discuss with Dr. MacDiarmid if he may be candidate for male sling   Chad JAYSON Barba, MD  [facility] [address] [place], [place] [address] [phone]     [1] No Known Allergies  "

## 2024-12-18 NOTE — Progress Notes (Signed)
 Patient presents for an office visit. BP today is High. Greater than 140/90. Provider  notified and recheck Blood Pressure .  Pt advised to talk with PCP.  Pt voiced understanding.

## 2024-12-19 LAB — PSA: Prostate Specific Ag, Serum: <0.1 ng/mL (ref 0.0–4.0)

## 2025-12-19 ENCOUNTER — Ambulatory Visit: Admitting: Urology
